# Patient Record
Sex: Female | Born: 1990 | Race: White | Hispanic: No | Marital: Married | Smoking: Never smoker
Health system: Southern US, Community
[De-identification: ages and names within clinical notes are randomized; demographics above are authoritative.]

## PROBLEM LIST (undated history)

## (undated) ENCOUNTER — Inpatient Hospital Stay (HOSPITAL_COMMUNITY): Payer: Self-pay

## (undated) DIAGNOSIS — G709 Myoneural disorder, unspecified: Secondary | ICD-10-CM

## (undated) DIAGNOSIS — Z789 Other specified health status: Secondary | ICD-10-CM

## (undated) DIAGNOSIS — K589 Irritable bowel syndrome without diarrhea: Secondary | ICD-10-CM

## (undated) DIAGNOSIS — G510 Bell's palsy: Secondary | ICD-10-CM

## (undated) HISTORY — PX: NO PAST SURGERIES: SHX2092

## (undated) HISTORY — PX: CHOLECYSTECTOMY: SHX55

---

## 2010-11-05 LAB — ABO/RH: RH Type: POSITIVE

## 2010-11-05 LAB — STREP B DNA PROBE: GBS: NEGATIVE

## 2010-11-05 LAB — ANTIBODY SCREEN: Antibody Screen: NEGATIVE

## 2010-12-06 ENCOUNTER — Inpatient Hospital Stay (HOSPITAL_COMMUNITY)
Admission: AD | Admit: 2010-12-06 | Discharge: 2010-12-06 | Disposition: A | Discharge status: Home / Self Care | Payer: No Typology Code available for payment source | Source: Ambulatory Visit | Attending: Obstetrics and Gynecology | Admitting: Obstetrics and Gynecology

## 2010-12-06 DIAGNOSIS — O210 Mild hyperemesis gravidarum: Secondary | ICD-10-CM

## 2010-12-06 LAB — URINALYSIS, ROUTINE W REFLEX MICROSCOPIC
Bilirubin Urine: NEGATIVE
Hgb urine dipstick: NEGATIVE
Ketones, ur: NEGATIVE mg/dL
Specific Gravity, Urine: 1.015 (ref 1.005–1.030)
Urobilinogen, UA: 0.2 mg/dL (ref 0.0–1.0)

## 2010-12-06 LAB — COMPREHENSIVE METABOLIC PANEL
ALT: 14 U/L (ref 0–35)
Alkaline Phosphatase: 67 U/L (ref 39–117)
BUN: 8 mg/dL (ref 6–23)
CO2: 22 mEq/L (ref 19–32)
Calcium: 9.7 mg/dL (ref 8.4–10.5)
GFR calc non Af Amer: >60 mL/min (ref >60)
Glucose, Bld: 80 mg/dL (ref 70–99)
Potassium: 3.9 mEq/L (ref 3.5–5.1)
Sodium: 135 mEq/L (ref 135–145)

## 2011-05-12 ENCOUNTER — Inpatient Hospital Stay (HOSPITAL_COMMUNITY): Payer: No Typology Code available for payment source

## 2011-05-12 ENCOUNTER — Encounter (HOSPITAL_COMMUNITY): Payer: Self-pay

## 2011-05-12 ENCOUNTER — Inpatient Hospital Stay (HOSPITAL_COMMUNITY)
Admission: AD | Admit: 2011-05-12 | Discharge: 2011-05-12 | Disposition: A | Discharge status: Home / Self Care | Payer: No Typology Code available for payment source | Source: Ambulatory Visit | Attending: Obstetrics and Gynecology | Admitting: Obstetrics and Gynecology

## 2011-05-12 DIAGNOSIS — O368190 Decreased fetal movements, unspecified trimester, not applicable or unspecified: Secondary | ICD-10-CM | POA: Insufficient documentation

## 2011-05-12 HISTORY — DX: Irritable bowel syndrome without diarrhea: K58.9

## 2011-05-12 NOTE — ED Provider Notes (Signed)
History     Chief Complaint  Patient presents with  . Decreased Fetal Movement   HPI 20 y.o. G1 P0 at [redacted]w[redacted]d Presents from office for NST and [place].  Has not felt FM since last Pm, except while here, has felt fetal movement. No leaking, bleeding or contractions. Was seen by Dr Claiborne Billings in office.    Past Medical History  Diagnosis Date  . Irritable bowel syndrome (IBS)     Past Surgical History  Procedure Date  . Cholecystectomy     History reviewed. No pertinent family history.  History  Substance Use Topics  . Smoking status: Never Smoker   . Smokeless tobacco: Never Used  . Alcohol Use: No    Allergies:  Allergies  Allergen Reactions  . Food Other (See Comments)    Pear-childhood reaction  . Latex Rash    Prescriptions prior to admission  Medication Sig Dispense Refill  . Pediatric Multivit-Minerals-C (GUMMI BEAR MULTIVITAMIN/MIN PO) Take 2 tablets by mouth daily.          ROS See above Physical Exam   Blood pressure 119/55, pulse 102, temperature 97.9 F (36.6 C), temperature source Oral, resp. rate 16, height 5\' 1"  (1.549 m), weight 179 lb (81.194 kg).  Physical Exam  Constitutional: She is oriented to person, place, and time. She appears well-developed and well-nourished. No distress.  Cardiovascular: Normal rate.   Respiratory: Effort normal.  GI: Soft. She exhibits no distension and no mass. There is no tenderness. There is no rebound and no guarding.  Genitourinary:       NST Reactive with average variability and no decels. No UCs... Category 1  Musculoskeletal: Normal range of motion.  Neurological: She is alert and oriented to person, place, and time.  Skin: Skin is warm and dry.  Psychiatric: She has a normal mood and affect.    MAU Course  Procedures   Assessment and Plan  A/P:  Discussed with Dr Henderson Cloud          Will check BPP and AFI          IF WNL, will d/c home.    [facility] 05/12/2011, 3:29 PM

## 2011-05-12 NOTE — Progress Notes (Addendum)
Pt states was sent to MAU for eval of decreased fm per Dr. Henderson Cloud, cervical exam showed external os open, internal os closed. Last good fm felt last pm sometime. Denies bleeding or lof.

## 2011-06-01 ENCOUNTER — Inpatient Hospital Stay (HOSPITAL_COMMUNITY)
Admission: AD | Admit: 2011-06-01 | Discharge: 2011-06-01 | Disposition: A | Discharge status: Home / Self Care | Payer: No Typology Code available for payment source | Source: Ambulatory Visit | Attending: Obstetrics and Gynecology | Admitting: Obstetrics and Gynecology

## 2011-06-01 DIAGNOSIS — G510 Bell's palsy: Secondary | ICD-10-CM

## 2011-06-01 DIAGNOSIS — IMO0002 Reserved for concepts with insufficient information to code with codable children: Secondary | ICD-10-CM

## 2011-06-01 DIAGNOSIS — O99891 Other specified diseases and conditions complicating pregnancy: Secondary | ICD-10-CM | POA: Insufficient documentation

## 2011-06-01 DIAGNOSIS — R209 Unspecified disturbances of skin sensation: Secondary | ICD-10-CM | POA: Insufficient documentation

## 2011-06-01 MED ORDER — PREDNISONE 50 MG PO TABS
60.0000 mg | ORAL_TABLET | Freq: Once | ORAL | Status: AC
  Administered 2011-06-01: 60 mg via ORAL
  Filled 2011-06-01: qty 1

## 2011-06-01 MED ORDER — PREDNISONE 10 MG PO TABS: ORAL_TABLET | ORAL | Status: DC

## 2011-06-01 NOTE — ED Provider Notes (Signed)
History     CSN: [id number] Arrival date & time: 06/01/2011  5:13 PM   None     Chief Complaint  Patient presents with  . Numbness    HPI Meagan Austin is a 20 y.o. female @ [redacted]w[redacted]d gestation who presents to MAU for left side facial numbness. She has a history of Bell's Palsy as a child. The symptoms started yesterday with just a tingling on the right side of the tongue. Today noted right eye tearing and face feeling numb on right side.  Denies extremity weakness or other problems.  Past Medical History  Diagnosis Date  . Irritable bowel syndrome (IBS)     Past Surgical History  Procedure Date  . Cholecystectomy     No family history on file.  History  Substance Use Topics  . Smoking status: Never Smoker   . Smokeless tobacco: Never Used  . Alcohol Use: No    OB History    Grav Para Term Preterm Abortions TAB SAB Ect Mult Living   1               Review of Systems  Constitutional: Negative for fever, chills and appetite change.  HENT: Negative for hearing loss, facial swelling, neck pain and tinnitus.        Facial numbness on the right. Decreased taste sensation.  Eyes: Positive for photophobia and discharge. Negative for visual disturbance.  Respiratory: Negative.   Cardiovascular: Negative.   Gastrointestinal: Negative.   Genitourinary: Negative for urgency, frequency, vaginal bleeding and vaginal discharge.       [redacted] weeks gestation  Musculoskeletal: Negative for back pain.  Neurological: Negative for light-headedness and headaches.  Psychiatric/Behavioral: Negative for confusion and agitation.    Allergies  Food and Latex  Home Medications  No current outpatient prescriptions on file.  BP 126/55  Pulse 104  Temp(Src) 98.4 F (36.9 C) (Oral)  Resp 16  Ht 5\' 1"  (1.549 m)  Wt 187 lb (84.823 kg)  BMI 35.33 kg/m2  Physical Exam  Nursing note and vitals reviewed. Constitutional: She is oriented to person, place, and time. She appears well-developed  and well-nourished. No distress.  HENT:  Head: Atraumatic.  Eyes: EOM are normal.  Neck: Neck supple.  Cardiovascular:       tachycardia  Pulmonary/Chest: Effort normal.  Abdominal: Soft. There is no tenderness.       [redacted] weeks gestation  Genitourinary:       Cervix finger tip thick, high.  Musculoskeletal: Normal range of motion.  Neurological: She is alert and oriented to person, place, and time. A cranial nerve deficit (right eye does not close completely. Decreased sensation right side of face. Can not distinguish between salt and sweet on right side of tongue.) and sensory deficit is present.  Skin: Skin is warm and dry.  Psychiatric: She has a normal mood and affect. Her behavior is normal. Judgment and thought content normal.   Assessment:  Bell's Palsy  Plan:  Prednisone taper   Follow up in the office  ED Course: Discussed with Dr. Janee Morn  Procedures            [place], NP 06/01/11 [phone]

## 2011-06-01 NOTE — Progress Notes (Signed)
Pt states her tongue started feeling funny yesterday but she did not pay any attention to it and started feeling the numbness when she woke up

## 2011-06-01 NOTE — Progress Notes (Signed)
Pt states noted r sided facial numbness around 1030, r sided mild facial droop, unable to symmetrically puff out cheeks, equal grips bilaterally. Denies bleeding or lof, +FM.

## 2011-06-10 LAB — CULTURE, BETA STREP (GROUP B ONLY): Organism ID, Bacteria: NEGATIVE

## 2011-06-29 ENCOUNTER — Observation Stay (HOSPITAL_COMMUNITY)
Admission: AD | Admit: 2011-06-29 | Discharge: 2011-06-29 | Disposition: A | Discharge status: Home / Self Care | Payer: No Typology Code available for payment source | Source: Ambulatory Visit | Attending: Obstetrics & Gynecology | Admitting: Obstetrics & Gynecology

## 2011-06-29 ENCOUNTER — Encounter (HOSPITAL_COMMUNITY): Payer: Self-pay

## 2011-06-29 DIAGNOSIS — O99350 Diseases of the nervous system complicating pregnancy, unspecified trimester: Secondary | ICD-10-CM | POA: Diagnosis present

## 2011-06-29 DIAGNOSIS — K589 Irritable bowel syndrome without diarrhea: Secondary | ICD-10-CM | POA: Insufficient documentation

## 2011-06-29 DIAGNOSIS — O479 False labor, unspecified: Principal | ICD-10-CM | POA: Insufficient documentation

## 2011-06-29 DIAGNOSIS — G510 Bell's palsy: Secondary | ICD-10-CM | POA: Diagnosis present

## 2011-06-29 HISTORY — DX: Irritable bowel syndrome, unspecified: K58.9

## 2011-06-29 HISTORY — DX: Myoneural disorder, unspecified: G70.9

## 2011-06-29 NOTE — Discharge Summary (Signed)
SVE 4/50/-1-2.  Decreased UA & pain c UCs.  D/C home c labor precautions.  Instructed to return to hospital for induction at 0700 tomorrow (Friday, 06-30-11) AM c Dr Henderson Cloud (per pt's previous statement)

## 2011-06-29 NOTE — Progress Notes (Signed)
UCs started about 1345.  States she was 4-5cm in the office yesterday

## 2011-06-30 ENCOUNTER — Encounter (HOSPITAL_COMMUNITY): Payer: Self-pay

## 2011-06-30 ENCOUNTER — Inpatient Hospital Stay (HOSPITAL_COMMUNITY)
Admission: AD | Admit: 2011-06-30 | Discharge: 2011-07-03 | DRG: 766 | Disposition: A | Discharge status: Home / Self Care | Payer: No Typology Code available for payment source | Source: Ambulatory Visit | Attending: Obstetrics and Gynecology | Admitting: Obstetrics and Gynecology

## 2011-06-30 DIAGNOSIS — D649 Anemia, unspecified: Secondary | ICD-10-CM | POA: Diagnosis not present

## 2011-06-30 DIAGNOSIS — O9903 Anemia complicating the puerperium: Secondary | ICD-10-CM | POA: Diagnosis not present

## 2011-06-30 DIAGNOSIS — G510 Bell's palsy: Secondary | ICD-10-CM

## 2011-06-30 HISTORY — DX: Other specified health status: Z78.9

## 2011-06-30 LAB — CBC
MCH: 30.2 pg (ref 26.0–34.0)
MCHC: 33.8 g/dL (ref 30.0–36.0)
MCV: 89.2 fL (ref 78.0–100.0)
Platelets: 166 10*3/uL (ref 150–400)
RBC: 3.78 MIL/uL — ABNORMAL LOW (ref 3.87–5.11)
RDW: 13.9 % (ref 11.5–15.5)

## 2011-06-30 LAB — RPR: RPR Ser Ql: NONREACTIVE

## 2011-06-30 MED ORDER — LIDOCAINE HCL (PF) 1 % IJ SOLN
30.0000 mL | INTRAMUSCULAR | Status: DC | PRN
  Filled 2011-06-30: qty 30

## 2011-06-30 MED ORDER — ACETAMINOPHEN 325 MG PO TABS
650.0000 mg | ORAL_TABLET | ORAL | Status: DC | PRN
  Administered 2011-06-30: 650 mg via ORAL
  Filled 2011-06-30: qty 2

## 2011-06-30 MED ORDER — CITRIC ACID-SODIUM CITRATE 334-500 MG/5ML PO SOLN
30.0000 mL | ORAL | Status: DC | PRN
  Filled 2011-06-30: qty 15

## 2011-06-30 MED ORDER — PHENYLEPHRINE 40 MCG/ML (10ML) SYRINGE FOR IV PUSH (FOR BLOOD PRESSURE SUPPORT): 80.0000 ug | PREFILLED_SYRINGE | INTRAVENOUS | Status: DC | PRN

## 2011-06-30 MED ORDER — OXYTOCIN 20 UNITS IN LACTATED RINGERS INFUSION - SIMPLE: 125.0000 mL/h | Freq: Once | INTRAVENOUS | Status: DC

## 2011-06-30 MED ORDER — FENTANYL 2.5 MCG/ML BUPIVACAINE 1/10 % EPIDURAL INFUSION (WH - ANES)
INTRAMUSCULAR | Status: DC | PRN
  Administered 2011-06-30: 12 mL/h via EPIDURAL

## 2011-06-30 MED ORDER — OXYCODONE-ACETAMINOPHEN 5-325 MG PO TABS: 2.0000 | ORAL_TABLET | ORAL | Status: DC | PRN

## 2011-06-30 MED ORDER — FENTANYL 2.5 MCG/ML BUPIVACAINE 1/10 % EPIDURAL INFUSION (WH - ANES)
14.0000 mL/h | INTRAMUSCULAR | Status: DC
  Administered 2011-06-30 – 2011-07-01 (×7): 14 mL/h via EPIDURAL
  Filled 2011-06-30 (×7): qty 60

## 2011-06-30 MED ORDER — PHENYLEPHRINE 40 MCG/ML (10ML) SYRINGE FOR IV PUSH (FOR BLOOD PRESSURE SUPPORT)
80.0000 ug | PREFILLED_SYRINGE | INTRAVENOUS | Status: DC | PRN
  Filled 2011-06-30: qty 5

## 2011-06-30 MED ORDER — EPHEDRINE 5 MG/ML INJ: 10.0000 mg | INTRAVENOUS | Status: DC | PRN

## 2011-06-30 MED ORDER — LACTATED RINGERS IV SOLN
INTRAVENOUS | Status: DC
  Administered 2011-06-30: 10:00:00 via INTRAVENOUS
  Administered 2011-06-30: 300 mL via INTRAVENOUS
  Administered 2011-06-30: 18:00:00 via INTRAVENOUS

## 2011-06-30 MED ORDER — LACTATED RINGERS IV SOLN: 500.0000 mL | INTRAVENOUS | Status: DC | PRN

## 2011-06-30 MED ORDER — FLEET ENEMA 7-19 GM/118ML RE ENEM: 1.0000 | ENEMA | RECTAL | Status: DC | PRN

## 2011-06-30 MED ORDER — TERBUTALINE SULFATE 1 MG/ML IJ SOLN: 0.2500 mg | Freq: Once | INTRAMUSCULAR | Status: AC | PRN

## 2011-06-30 MED ORDER — OXYTOCIN BOLUS FROM INFUSION
500.0000 mL | Freq: Once | INTRAVENOUS | Status: DC
  Filled 2011-06-30: qty 500
  Filled 2011-06-30: qty 1000

## 2011-06-30 MED ORDER — IBUPROFEN 600 MG PO TABS: 600.0000 mg | ORAL_TABLET | Freq: Four times a day (QID) | ORAL | Status: DC | PRN

## 2011-06-30 MED ORDER — ZOLPIDEM TARTRATE 10 MG PO TABS: 10.0000 mg | ORAL_TABLET | Freq: Every evening | ORAL | Status: DC | PRN

## 2011-06-30 MED ORDER — EPHEDRINE 5 MG/ML INJ
10.0000 mg | INTRAVENOUS | Status: DC | PRN
  Filled 2011-06-30: qty 4

## 2011-06-30 MED ORDER — ONDANSETRON HCL 4 MG/2ML IJ SOLN: 4.0000 mg | Freq: Four times a day (QID) | INTRAMUSCULAR | Status: DC | PRN

## 2011-06-30 MED ORDER — DIPHENHYDRAMINE HCL 50 MG/ML IJ SOLN: 12.5000 mg | INTRAMUSCULAR | Status: DC | PRN

## 2011-06-30 MED ORDER — OXYTOCIN 20 UNITS IN LACTATED RINGERS INFUSION - SIMPLE
1.0000 m[IU]/min | INTRAVENOUS | Status: DC
  Administered 2011-06-30: 2 m[IU]/min via INTRAVENOUS

## 2011-06-30 MED ORDER — SODIUM BICARBONATE 8.4 % IV SOLN
INTRAVENOUS | Status: DC | PRN
  Administered 2011-06-30: 4 mL via EPIDURAL

## 2011-06-30 MED ORDER — LACTATED RINGERS IV SOLN
500.0000 mL | Freq: Once | INTRAVENOUS | Status: AC
  Administered 2011-07-01: 500 mL via INTRAVENOUS

## 2011-06-30 NOTE — H&P (Signed)
20 y.o. [redacted]w[redacted]d   G1P0000 comes in for induction at term with advanced cervical dilation.  Otherwise has good fetal movement and no bleeding.  Past Medical History  Diagnosis Date  . Irritable bowel syndrome (IBS) 06-29-11    Bilat ear infections, finished  Amoxicillin last weekend (06-24-11)  . Preterm labor   . Neuromuscular disorder     R side Bell's Palsy  . No pertinent past medical history     Past Surgical History  Procedure Date  . Cholecystectomy   . Cholecystectomy     2011  . No past surgeries     OB History    Grav Para Term Preterm Abortions TAB SAB Ect Mult Living   1 0 0 0 0 0 0 0 0 0      # Outc Date GA Lbr Len/2nd Wgt Sex Del Anes PTL Lv   1 CUR               History   Social History  . Marital Status: Married    Spouse Name: N/A    Number of Children: N/A  . Years of Education: N/A   Occupational History  . Not on file.   Social History Main Topics  . Smoking status: Never Smoker   . Smokeless tobacco: Never Used  . Alcohol Use: No  . Drug Use: No  . Sexually Active: Yes   Other Topics Concern  . Not on file   Social History Narrative  . No narrative on file   Food and Latex   Prenatal Course:  Uncomplicated except for Bell's palsy; no other complication.  Filed Vitals:   06/30/11 0753  BP: 140/78  Pulse: 110  Temp: 98 F (36.7 C)  Resp: 18     Lungs/Cor:  NAD Abdomen:  soft, gravid Ex:  no cords, erythema SVE:  5/80/-1, AROM clear. FHTs:  150s, good STV, NST R Toco:  q10   A/P   [redacted]w[redacted]d desires induction.  GBS neg.  Aunna Snooks A

## 2011-06-30 NOTE — Progress Notes (Signed)
Dr. Henderson Cloud called and notified of maternal temp of 98.7, FHR decrease but still tachy, and UC's.  Will check SVE at midnight.

## 2011-06-30 NOTE — Anesthesia Preprocedure Evaluation (Addendum)
Anesthesia Evaluation  Patient identified by MRN, date of birth, ID band Patient awake    Reviewed: Allergy & Precautions, H&P , Patient's Chart, lab work & pertinent test results  Airway Mallampati: III TM Distance: >3 FB Neck ROM: full    Dental  (+) Teeth Intact   Pulmonary  clear to auscultation        Cardiovascular regular Normal    Neuro/Psych    GI/Hepatic   Endo/Other  Morbid obesity  Renal/GU      Musculoskeletal   Abdominal   Peds  Hematology   Anesthesia Other Findings       Reproductive/Obstetrics (+) Pregnancy                        Anesthesia Physical Anesthesia Plan  ASA: II  Anesthesia Plan: Epidural   Post-op Pain Management:    Induction:   Airway Management Planned:   Additional Equipment:   Intra-op Plan:   Post-operative Plan:   Informed Consent: I have reviewed the patients History and Physical, chart, labs and discussed the procedure including the risks, benefits and alternatives for the proposed anesthesia with the patient or authorized representative who has indicated his/her understanding and acceptance.   Dental Advisory Given  Plan Discussed with:   Anesthesia Plan Comments: (Labs checked- platelets confirmed with RN in room. Fetal heart tracing, per RN, reported to be stable enough for sitting procedure. Discussed epidural, and patient consents to the procedure:  included risk of possible headache,backache, failed block, allergic reaction, and nerve injury. This patient was asked if she had any questions or concerns before the procedure started. )        Anesthesia Quick Evaluation

## 2011-06-30 NOTE — Anesthesia Procedure Notes (Addendum)

## 2011-06-30 NOTE — Progress Notes (Signed)
Pt comfortable.  Filed Vitals:   06/30/11 2101  BP: 120/65  Pulse: 104  Temp: 99.4 F (37.4 C)  Resp: 18   FHTs 150-180s, good variability and NST R; no decels. Toco q3-5 SVE 9/C/-2  Will watch FHTs- tachycardia probably secondary developing temperature.  Tylenol given; will give antibiotics if spikes. Emrys Mceachron A

## 2011-07-01 ENCOUNTER — Encounter (HOSPITAL_COMMUNITY): Payer: Self-pay | Admitting: Anesthesiology

## 2011-07-01 ENCOUNTER — Encounter (HOSPITAL_COMMUNITY)
Admission: AD | Disposition: A | Discharge status: Home / Self Care | Payer: Self-pay | Source: Ambulatory Visit | Attending: Obstetrics and Gynecology

## 2011-07-01 ENCOUNTER — Inpatient Hospital Stay (HOSPITAL_COMMUNITY): Payer: No Typology Code available for payment source | Admitting: Anesthesiology

## 2011-07-01 ENCOUNTER — Encounter (HOSPITAL_COMMUNITY): Payer: Self-pay

## 2011-07-01 SURGERY — Surgical Case
Anesthesia: Epidural | Site: Abdomen | Wound class: Clean Contaminated

## 2011-07-01 MED ORDER — OXYTOCIN 10 UNIT/ML IJ SOLN
INTRAMUSCULAR | Status: AC
  Filled 2011-07-01: qty 4

## 2011-07-01 MED ORDER — WITCH HAZEL-GLYCERIN EX PADS: 1.0000 "application " | MEDICATED_PAD | CUTANEOUS | Status: DC | PRN

## 2011-07-01 MED ORDER — SODIUM BICARBONATE 8.4 % IV SOLN
INTRAVENOUS | Status: AC
  Filled 2011-07-01: qty 50

## 2011-07-01 MED ORDER — DIPHENHYDRAMINE HCL 25 MG PO CAPS: 25.0000 mg | ORAL_CAPSULE | ORAL | Status: DC | PRN

## 2011-07-01 MED ORDER — KETOROLAC TROMETHAMINE 60 MG/2ML IM SOLN
60.0000 mg | Freq: Once | INTRAMUSCULAR | Status: AC | PRN
  Administered 2011-07-01: 60 mg via INTRAMUSCULAR

## 2011-07-01 MED ORDER — LACTATED RINGERS IV SOLN
INTRAVENOUS | Status: DC | PRN
  Administered 2011-07-01 (×4): via INTRAVENOUS

## 2011-07-01 MED ORDER — LIDOCAINE-EPINEPHRINE (PF) 2 %-1:200000 IJ SOLN
INTRAMUSCULAR | Status: AC
  Filled 2011-07-01: qty 40

## 2011-07-01 MED ORDER — MORPHINE SULFATE (PF) 0.5 MG/ML IJ SOLN
INTRAMUSCULAR | Status: DC | PRN
  Administered 2011-07-01: 1 mg via EPIDURAL

## 2011-07-01 MED ORDER — MIDAZOLAM HCL 2 MG/2ML IJ SOLN
INTRAMUSCULAR | Status: AC
  Filled 2011-07-01: qty 2

## 2011-07-01 MED ORDER — METHYLERGONOVINE MALEATE 0.2 MG PO TABS: 0.2000 mg | ORAL_TABLET | ORAL | Status: DC | PRN

## 2011-07-01 MED ORDER — OXYTOCIN 20 UNITS IN LACTATED RINGERS INFUSION - SIMPLE
125.0000 mL/h | INTRAVENOUS | Status: AC
  Administered 2011-07-01: 125 mL/h via INTRAVENOUS

## 2011-07-01 MED ORDER — LANOLIN HYDROUS EX OINT: 1.0000 "application " | TOPICAL_OINTMENT | CUTANEOUS | Status: DC | PRN

## 2011-07-01 MED ORDER — OXYTOCIN 20 UNITS IN LACTATED RINGERS INFUSION - SIMPLE
INTRAVENOUS | Status: DC | PRN
  Administered 2011-07-01 (×2): 20 [IU] via INTRAVENOUS

## 2011-07-01 MED ORDER — SCOPOLAMINE 1 MG/3DAYS TD PT72
1.0000 | MEDICATED_PATCH | Freq: Once | TRANSDERMAL | Status: DC
  Administered 2011-07-01: 1.5 mg via TRANSDERMAL

## 2011-07-01 MED ORDER — IBUPROFEN 600 MG PO TABS
600.0000 mg | ORAL_TABLET | Freq: Four times a day (QID) | ORAL | Status: DC
  Administered 2011-07-02 – 2011-07-03 (×6): 600 mg via ORAL
  Filled 2011-07-01: qty 1

## 2011-07-01 MED ORDER — MORPHINE SULFATE 0.5 MG/ML IJ SOLN
INTRAMUSCULAR | Status: AC
  Filled 2011-07-01: qty 10

## 2011-07-01 MED ORDER — FLEET ENEMA 7-19 GM/118ML RE ENEM: 1.0000 | ENEMA | Freq: Every day | RECTAL | Status: DC | PRN

## 2011-07-01 MED ORDER — NALOXONE HCL 0.4 MG/ML IJ SOLN: 0.4000 mg | INTRAMUSCULAR | Status: DC | PRN

## 2011-07-01 MED ORDER — PRENATAL MULTIVITAMIN CH
1.0000 | ORAL_TABLET | Freq: Every day | ORAL | Status: DC
  Administered 2011-07-02 – 2011-07-03 (×2): 1 via ORAL
  Filled 2011-07-01: qty 1

## 2011-07-01 MED ORDER — ONDANSETRON HCL 4 MG PO TABS: 4.0000 mg | ORAL_TABLET | ORAL | Status: DC | PRN

## 2011-07-01 MED ORDER — IBUPROFEN 600 MG PO TABS
600.0000 mg | ORAL_TABLET | Freq: Four times a day (QID) | ORAL | Status: DC | PRN
  Filled 2011-07-01 (×5): qty 1

## 2011-07-01 MED ORDER — NALBUPHINE SYRINGE 5 MG/0.5 ML
5.0000 mg | INJECTION | INTRAMUSCULAR | Status: DC | PRN
  Filled 2011-07-01: qty 1

## 2011-07-01 MED ORDER — MIDAZOLAM HCL 5 MG/5ML IJ SOLN
INTRAMUSCULAR | Status: DC | PRN
  Administered 2011-07-01 (×2): 1 mg via INTRAVENOUS

## 2011-07-01 MED ORDER — OXYCODONE-ACETAMINOPHEN 5-325 MG PO TABS
1.0000 | ORAL_TABLET | ORAL | Status: DC | PRN
  Administered 2011-07-02 – 2011-07-03 (×5): 1 via ORAL
  Filled 2011-07-01: qty 1
  Filled 2011-07-01: qty 2
  Filled 2011-07-01 (×3): qty 1

## 2011-07-01 MED ORDER — DIPHENHYDRAMINE HCL 50 MG/ML IJ SOLN: 25.0000 mg | INTRAMUSCULAR | Status: DC | PRN

## 2011-07-01 MED ORDER — DIPHENHYDRAMINE HCL 50 MG/ML IJ SOLN: 12.5000 mg | INTRAMUSCULAR | Status: DC | PRN

## 2011-07-01 MED ORDER — SCOPOLAMINE 1 MG/3DAYS TD PT72
MEDICATED_PATCH | TRANSDERMAL | Status: AC
  Filled 2011-07-01: qty 1

## 2011-07-01 MED ORDER — MEASLES, MUMPS & RUBELLA VAC ~~LOC~~ INJ
0.5000 mL | INJECTION | Freq: Once | SUBCUTANEOUS | Status: DC
  Filled 2011-07-01: qty 0.5

## 2011-07-01 MED ORDER — SIMETHICONE 80 MG PO CHEW: 80.0000 mg | CHEWABLE_TABLET | ORAL | Status: DC | PRN

## 2011-07-01 MED ORDER — CEFAZOLIN SODIUM 1-5 GM-% IV SOLN
INTRAVENOUS | Status: DC | PRN
  Administered 2011-07-01: 2 g via INTRAVENOUS

## 2011-07-01 MED ORDER — CEFAZOLIN SODIUM 1-5 GM-% IV SOLN
INTRAVENOUS | Status: AC
  Filled 2011-07-01: qty 100

## 2011-07-01 MED ORDER — ONDANSETRON HCL 4 MG/2ML IJ SOLN
INTRAMUSCULAR | Status: AC
  Filled 2011-07-01: qty 2

## 2011-07-01 MED ORDER — KETOROLAC TROMETHAMINE 30 MG/ML IJ SOLN: 15.0000 mg | Freq: Once | INTRAMUSCULAR | Status: DC | PRN

## 2011-07-01 MED ORDER — ONDANSETRON HCL 4 MG/2ML IJ SOLN
4.0000 mg | Freq: Three times a day (TID) | INTRAMUSCULAR | Status: DC | PRN
  Filled 2011-07-01: qty 2

## 2011-07-01 MED ORDER — HYDROMORPHONE HCL PF 1 MG/ML IJ SOLN: 0.2500 mg | INTRAMUSCULAR | Status: DC | PRN

## 2011-07-01 MED ORDER — KETOROLAC TROMETHAMINE 60 MG/2ML IM SOLN
INTRAMUSCULAR | Status: AC
  Filled 2011-07-01: qty 2

## 2011-07-01 MED ORDER — MENTHOL 3 MG MT LOZG: 1.0000 | LOZENGE | OROMUCOSAL | Status: DC | PRN

## 2011-07-01 MED ORDER — MORPHINE SULFATE (PF) 0.5 MG/ML IJ SOLN
INTRAMUSCULAR | Status: DC | PRN
  Administered 2011-07-01: 4 mg via EPIDURAL

## 2011-07-01 MED ORDER — TETANUS-DIPHTH-ACELL PERTUSSIS 5-2.5-18.5 LF-MCG/0.5 IM SUSP: 0.5000 mL | Freq: Once | INTRAMUSCULAR | Status: DC

## 2011-07-01 MED ORDER — ZOLPIDEM TARTRATE 5 MG PO TABS: 5.0000 mg | ORAL_TABLET | Freq: Every evening | ORAL | Status: DC | PRN

## 2011-07-01 MED ORDER — LACTATED RINGERS IV SOLN
INTRAVENOUS | Status: DC
  Administered 2011-07-01 (×2): via INTRAVENOUS

## 2011-07-01 MED ORDER — DIPHENHYDRAMINE HCL 25 MG PO CAPS: 25.0000 mg | ORAL_CAPSULE | Freq: Four times a day (QID) | ORAL | Status: DC | PRN

## 2011-07-01 MED ORDER — MEPERIDINE HCL 25 MG/ML IJ SOLN: 6.2500 mg | INTRAMUSCULAR | Status: DC | PRN

## 2011-07-01 MED ORDER — ONDANSETRON HCL 4 MG/2ML IJ SOLN
4.0000 mg | INTRAMUSCULAR | Status: DC | PRN
  Administered 2011-07-01 (×2): 4 mg via INTRAVENOUS
  Filled 2011-07-01: qty 2

## 2011-07-01 MED ORDER — PROMETHAZINE HCL 25 MG/ML IJ SOLN: 6.2500 mg | INTRAMUSCULAR | Status: DC | PRN

## 2011-07-01 MED ORDER — OXYTOCIN 20 UNITS IN LACTATED RINGERS INFUSION - SIMPLE
INTRAVENOUS | Status: AC
  Filled 2011-07-01: qty 1000

## 2011-07-01 MED ORDER — METHYLERGONOVINE MALEATE 0.2 MG/ML IJ SOLN: 0.2000 mg | INTRAMUSCULAR | Status: DC | PRN

## 2011-07-01 MED ORDER — SIMETHICONE 80 MG PO CHEW
80.0000 mg | CHEWABLE_TABLET | Freq: Three times a day (TID) | ORAL | Status: DC
  Administered 2011-07-01 – 2011-07-03 (×6): 80 mg via ORAL

## 2011-07-01 MED ORDER — NALOXONE HCL 0.4 MG/ML IJ SOLN
1.0000 ug/kg/h | INTRAMUSCULAR | Status: DC | PRN
  Filled 2011-07-01: qty 2.5

## 2011-07-01 MED ORDER — KETOROLAC TROMETHAMINE 30 MG/ML IJ SOLN
30.0000 mg | Freq: Four times a day (QID) | INTRAMUSCULAR | Status: AC | PRN
  Administered 2011-07-01: 30 mg via INTRAVENOUS
  Filled 2011-07-01: qty 1

## 2011-07-01 MED ORDER — FENTANYL CITRATE 0.05 MG/ML IJ SOLN
INTRAMUSCULAR | Status: DC | PRN
  Administered 2011-07-01: 100 ug via INTRAVENOUS

## 2011-07-01 MED ORDER — SENNOSIDES-DOCUSATE SODIUM 8.6-50 MG PO TABS
2.0000 | ORAL_TABLET | Freq: Every day | ORAL | Status: DC
  Administered 2011-07-01 – 2011-07-02 (×2): 2 via ORAL

## 2011-07-01 MED ORDER — KETOROLAC TROMETHAMINE 30 MG/ML IJ SOLN: 30.0000 mg | Freq: Four times a day (QID) | INTRAMUSCULAR | Status: AC | PRN

## 2011-07-01 MED ORDER — BISACODYL 10 MG RE SUPP: 10.0000 mg | Freq: Every day | RECTAL | Status: DC | PRN

## 2011-07-01 MED ORDER — FENTANYL CITRATE 0.05 MG/ML IJ SOLN
INTRAMUSCULAR | Status: AC
  Filled 2011-07-01: qty 2

## 2011-07-01 MED ORDER — DIBUCAINE 1 % RE OINT: 1.0000 "application " | TOPICAL_OINTMENT | RECTAL | Status: DC | PRN

## 2011-07-01 MED ORDER — FERROUS SULFATE 325 (65 FE) MG PO TABS
325.0000 mg | ORAL_TABLET | Freq: Two times a day (BID) | ORAL | Status: DC
  Administered 2011-07-02 (×2): 325 mg via ORAL
  Filled 2011-07-01 (×2): qty 1

## 2011-07-01 MED ORDER — SODIUM CHLORIDE 0.9 % IJ SOLN: 3.0000 mL | INTRAMUSCULAR | Status: DC | PRN

## 2011-07-01 SURGICAL SUPPLY — 27 items
CHLORAPREP W/TINT 26ML (MISCELLANEOUS) ×6 IMPLANT
CLOTH BEACON ORANGE TIMEOUT ST (SAFETY) ×2 IMPLANT
DRSG COVADERM 4X6 (GAUZE/BANDAGES/DRESSINGS) ×2 IMPLANT
ELECT REM PT RETURN 9FT ADLT (ELECTROSURGICAL) ×2
ELECTRODE REM PT RTRN 9FT ADLT (ELECTROSURGICAL) ×1 IMPLANT
EXTRACTOR VACUUM BELL STYLE (SUCTIONS) IMPLANT
GLOVE BIO SURGEON STRL SZ7 (GLOVE) ×4 IMPLANT
GOWN PREVENTION PLUS LG XLONG (DISPOSABLE) ×4 IMPLANT
KIT ABG SYR 3ML LUER SLIP (SYRINGE) IMPLANT
NEEDLE HYPO 25X5/8 SAFETYGLIDE (NEEDLE) ×2 IMPLANT
NS IRRIG 1000ML POUR BTL (IV SOLUTION) ×2 IMPLANT
PACK C SECTION WH (CUSTOM PROCEDURE TRAY) ×2 IMPLANT
RETRACTOR WND ALEXIS 25 LRG (MISCELLANEOUS) ×1 IMPLANT
RTRCTR WOUND ALEXIS 25CM LRG (MISCELLANEOUS) ×2
SLEEVE SCD COMPRESS KNEE MED (MISCELLANEOUS) IMPLANT
STAPLER VISISTAT 35W (STAPLE) IMPLANT
SUT MNCRL 0 VIOLET CTX 36 (SUTURE) ×2 IMPLANT
SUT MONOCRYL 0 CTX 36 (SUTURE) ×2
SUT PDS AB 0 CTX 60 (SUTURE) IMPLANT
SUT PLAIN 2 0 XLH (SUTURE) ×2 IMPLANT
SUT VIC AB 0 CT1 27 (SUTURE) ×2
SUT VIC AB 0 CT1 27XBRD ANBCTR (SUTURE) ×2 IMPLANT
SUT VIC AB 2-0 CT1 27 (SUTURE) ×1
SUT VIC AB 2-0 CT1 TAPERPNT 27 (SUTURE) ×1 IMPLANT
TOWEL OR 17X24 6PK STRL BLUE (TOWEL DISPOSABLE) ×4 IMPLANT
TRAY FOLEY CATH 14FR (SET/KITS/TRAYS/PACK) IMPLANT
WATER STERILE IRR 1000ML POUR (IV SOLUTION) IMPLANT

## 2011-07-01 NOTE — Anesthesia Postprocedure Evaluation (Signed)
  Anesthesia Post-op Note  Patient: Meagan Austin  Procedure(s) Performed:  CESAREAN SECTION  Patient Location: PACU  Anesthesia Type: Epidural  Level of Consciousness: awake, alert , oriented and patient cooperative  Airway and Oxygen Therapy: Patient Spontanous Breathing  Post-op Pain: none  Post-op Assessment: Post-op Vital signs reviewed  Post-op Vital Signs: Reviewed and stable  Complications: No apparent anesthesia complications

## 2011-07-01 NOTE — Transfer of Care (Signed)
Immediate Anesthesia Transfer of Care Note  Patient: Meagan Austin  Procedure(s) Performed:  CESAREAN SECTION  Patient Location: PACU  Anesthesia Type: Epidural  Level of Consciousness: Awake  Airway & Oxygen Therapy: None  Post-op Assessment: Patient stable, report given  Post vital signs: Stable  Complications: None

## 2011-07-01 NOTE — Brief Op Note (Signed)
06/30/2011 - 07/01/2011  5:44 AM  PATIENT:  Meagan Austin  20 y.o. female  PRE-OPERATIVE DIAGNOSIS:  Arrest of active phase, nonreasuring FHTs  POST-OPERATIVE DIAGNOSIS:  same  PROCEDURE:  Procedure(s): CESAREAN SECTION  SURGEON:  Surgeon(s): Wilsie Kern A Phillips Goulette, MD :   ASSISTANTS: none   ANESTHESIA:   epidural  EBL:  Total I/O In: 2348.9 [I.V.:2348.9] Out: 1700 [Urine:550; Blood:1150]  SPECIMEN:  No Specimen  DISPOSITION OF SPECIMEN:  N/A  COUNTS:  YES  DICTATION: see below.  PLAN OF CARE: Admit to inpatient   PATIENT DISPOSITION:  PACU - hemodynamically stable.    Delay start of Pharmacological VTE agent (>24hrs) due to surgical blood loss or risk of bleeding: NOT APPLICABLE:20182 Complications:  none Medications:  Ancef, Pitocin Findings:  Baby female, Apgars 8,9, weight 9#4.   Normal tubes, ovaries and uterus seen.  Technique:  After adequate epidural anesthesia was achieved, the patient was prepped and draped in usual sterile fashion.  A foley catheter was used to drain the bladder.  A pfannanstiel incision was made with the scalpel and carried down to the fascia with the bovie cautery. The fascia was incised in the midline with the scalpel and carried in a transverse curvilinear manner bilaterally.  The fascia was reflected superiorly and inferiorly off the rectus muscles and the muscles split in the midline.  A bowel free portion of the peritoneum was entered bluntly and then extended in a superior and inferior manner with good visualization of the bowel and bladder.  The Alexis instrument was then placed and the vesico-uterine fascia tented up and incised in a transverse curvilinear manner.  A 2 cm incision was made in the upper portion of the lower uterine segment until the amnion was exposed.  Clear fluid was noted and the baby delivered in the vertex OP presentation without complication.  The baby was bulb suctioned and the cord was clamped and cut.  The baby  was then handed to awaiting Neonatology.  The placenta was then delivered manually and the uterus cleared of all debris.  The uterine incision was then closed with a running lock stitch of 0 monocryl.  An imbricating layer of 0 monocryl was closed as well. Good hemostasis of the uterine incision was achieved, the abdomen was cleared with irrigation and the peritoneum was closed with a running stitch of 2-0 vicryl.  This incorporated the rectus muscles as a separate layer.  The fascia was then closed with a running stitch of 0 vicryl.  The subcutaneous layer was closed with interrupted  stitches of 2-0 plain gut.  The skin was closed with staples.  The patient tolerated the procedure well and was returned to the recovery room in stable condition.  All counts were correct times three.  Rashawn Rayman A    

## 2011-07-01 NOTE — Anesthesia Postprocedure Evaluation (Signed)
Anesthesia Post Note  Patient: Meagan Austin  Procedure(s) Performed:  CESAREAN SECTION  Anesthesia type: Epidural  Patient location: PACU  Post pain: Pain level controlled  Post assessment: Post-op Vital signs reviewed  Last Vitals:  Filed Vitals:   07/01/11 0615  BP: 94/40  Pulse: 115  Temp:   Resp: 20    Post vital signs: Reviewed  Level of consciousness: awake  Complications: No apparent anesthesia complications

## 2011-07-01 NOTE — Progress Notes (Signed)
Pt has labored down for 2 hours.    FHTs  150s, gstv, gltv, two subtle late decels seen. Toco q4 SVE by myself- still anterior lip/C/-2; OP- unable to rotate  A/P  Pt has been 9 to anterior lip with pitocin since 10 pm.  FHTs beginning to not be reassuring.  D/W pt R/B/Alt- for C/S for arrest of active phase and NRFHTs.  Jovanni Rash A

## 2011-07-01 NOTE — Progress Notes (Signed)
Dr. Henderson Cloud phoned and notified of pushing results.  Also notified of anesthesia bolus dosing of epidural due to ineffective pain control.  Patient to labor down for a while and then nurse to call Dr. Henderson Cloud after 1 hour of continued pushing again.

## 2011-07-01 NOTE — Op Note (Signed)
06/30/2011 - 07/01/2011  5:44 AM  PATIENT:  Meagan Austin  20 y.o. female  PRE-OPERATIVE DIAGNOSIS:  Arrest of active phase, nonreasuring FHTs  POST-OPERATIVE DIAGNOSIS:  same  PROCEDURE:  Procedure(s): CESAREAN SECTION  SURGEON:  Surgeon(s): Loney Laurence, MD :   ASSISTANTS: none   ANESTHESIA:   epidural  EBL:  Total I/O In: 2348.9 [I.V.:2348.9] Out: 1700 [Urine:550; Blood:1150]  SPECIMEN:  No Specimen  DISPOSITION OF SPECIMEN:  N/A  COUNTS:  YES  DICTATION: see below.  PLAN OF CARE: Admit to inpatient   PATIENT DISPOSITION:  PACU - hemodynamically stable.    Delay start of Pharmacological VTE agent (>24hrs) due to surgical blood loss or risk of bleeding: NOT APPLICABLE:20182 Complications:  none Medications:  Ancef, Pitocin Findings:  Baby female, Apgars 8,9, weight 9#4.   Normal tubes, ovaries and uterus seen.  Technique:  After adequate epidural anesthesia was achieved, the patient was prepped and draped in usual sterile fashion.  A foley catheter was used to drain the bladder.  A pfannanstiel incision was made with the scalpel and carried down to the fascia with the bovie cautery. The fascia was incised in the midline with the scalpel and carried in a transverse curvilinear manner bilaterally.  The fascia was reflected superiorly and inferiorly off the rectus muscles and the muscles split in the midline.  A bowel free portion of the peritoneum was entered bluntly and then extended in a superior and inferior manner with good visualization of the bowel and bladder.  The Alexis instrument was then placed and the vesico-uterine fascia tented up and incised in a transverse curvilinear manner.  A 2 cm incision was made in the upper portion of the lower uterine segment until the amnion was exposed.  Clear fluid was noted and the baby delivered in the vertex OP presentation without complication.  The baby was bulb suctioned and the cord was clamped and cut.  The baby  was then handed to awaiting Neonatology.  The placenta was then delivered manually and the uterus cleared of all debris.  The uterine incision was then closed with a running lock stitch of 0 monocryl.  An imbricating layer of 0 monocryl was closed as well. Good hemostasis of the uterine incision was achieved, the abdomen was cleared with irrigation and the peritoneum was closed with a running stitch of 2-0 vicryl.  This incorporated the rectus muscles as a separate layer.  The fascia was then closed with a running stitch of 0 vicryl.  The subcutaneous layer was closed with interrupted  stitches of 2-0 plain gut.  The skin was closed with staples.  The patient tolerated the procedure well and was returned to the recovery room in stable condition.  All counts were correct times three.  Zenora Karpel A

## 2011-07-01 NOTE — Anesthesia Postprocedure Evaluation (Signed)
  Anesthesia Post-op Note  Patient: Meagan Austin  Procedure(s) Performed:  CESAREAN SECTION  Patient Location: Mother/Baby  Anesthesia Type: Epidural  Level of Consciousness: awake, alert  and oriented  Airway and Oxygen Therapy: Patient Spontanous Breathing  Post-op Assessment: Patient's Cardiovascular Status Stable and Respiratory Function Stable  Post-op Vital Signs: Reviewed and stable  Complications: No apparent anesthesia complications

## 2011-07-01 NOTE — Addendum Note (Signed)
Addendum  created 07/01/11 1412 by Edison Pace, CRNA   Modules edited:Notes Section

## 2011-07-02 LAB — CBC
HCT: 19.6 % — ABNORMAL LOW (ref 36.0–46.0)
MCV: 90.3 fL (ref 78.0–100.0)
Platelets: 151 10*3/uL (ref 150–400)
RBC: 2.17 MIL/uL — ABNORMAL LOW (ref 3.87–5.11)
WBC: 11.6 10*3/uL — ABNORMAL HIGH (ref 4.0–10.5)

## 2011-07-02 NOTE — Progress Notes (Addendum)
MD on call notified of critical lab value, no new orders received.  Patient resting in bed.  MD told vital sign ranges throughout the shift.

## 2011-07-02 NOTE — Progress Notes (Signed)
POD#1 Pt without c/o. Has no symptoms of anemia. VSSAF IMP/ stable Plan/ start iron.

## 2011-07-02 NOTE — Progress Notes (Signed)
CRITICAL VALUE ALERT  Critical value received:  hgb 6.5  Date of notification:  07/02/11  Time of notification:  0556  Critical value read back: Yes  Nurse who received alert:  Billey Co RN  MD notified (1st page):  Dr. Dareen Piano  Time of first page:  0628  MD notified (2nd page):  Time of second page:  Responding MD:  Dr. Dareen Piano  Time MD responded:  [phone]

## 2011-07-03 MED ORDER — FERROUS SULFATE 325 (65 FE) MG PO TABS: 325.0000 mg | ORAL_TABLET | Freq: Two times a day (BID) | ORAL | Status: DC

## 2011-07-03 MED ORDER — OXYCODONE-ACETAMINOPHEN 5-325 MG PO TABS: 1.0000 | ORAL_TABLET | ORAL | Status: AC | PRN

## 2011-07-03 NOTE — Discharge Summary (Signed)
[name], Meagan Austin               ACCOUNT NO.:  [id number]  MEDICAL RECORD NO.:  [id number]  LOCATION:  9106                          FACILITY:  [facility]  PHYSICIAN:  Malva Limes, M.D.    DATE OF BIRTH:  [birth year]  DATE OF ADMISSION:  06/30/2011 DATE OF DISCHARGE:  07/03/2011                              DISCHARGE SUMMARY   PRINCIPAL DISCHARGE DIAGNOSES: 1. Intrauterine pregnancy at term. 2. Nonreassuring fetal heart rate tracing. 3. Arrest of active phase.  PRINCIPAL PROCEDURES: 1. Elective induction. 2. Primary low transverse cesarean section.  HISTORY OF PRESENT ILLNESS:  [name] is a 20 year old, white female, G1, P0, who was admitted on June 30, 2011, for an elective induction secondary to advanced cervical dilation.  Prenatal care was uncomplicated.  HOSPITAL COURSE:  The patient had an induction performed by Dr. Carrington Clamp.  The patient progressed to complete anterior lip and -2 station.  Baby was noted to be OP at that time and attempted rotation was unsuccessful.  She was felt to have a nonreassuring fetal heart rate tracing secondary to late decelerations and had made no change in her exam.  At that point, Dr. Henderson Cloud took her to the operating room for a primary cesarean section.  Complete description of this can be found in dictated operative note.  The patient's postop course was benign.  She expressed desire to go home on postop day #2.  The patient did have significant anemia after the surgery.  Her hemoglobin was 6.5, however, she had absolutely no symptoms.  At the time of discharge, she was ambulating without difficulty, eating regular diet and her incision appeared to be healing well.  The patient was sent home with Percocet, iron and prenatal vitamins.  She was told to follow up in the office in 4 weeks.          ______________________________ Malva Limes, M.D.     MA/MEDQ  D:  07/03/2011  T:  07/03/2011  Job:  [id number]

## 2011-07-03 NOTE — Progress Notes (Signed)
POD#2 Pt without c/o. Would like to go home early. Not symptomatic from anemia. VSSAF Imp/ stable. Plan/ discharge.

## 2011-07-06 ENCOUNTER — Encounter (HOSPITAL_COMMUNITY): Payer: Self-pay | Admitting: Obstetrics and Gynecology

## 2013-08-16 IMAGING — US US FETAL BPP W/O NONSTRESS
1 series · 13 of 28 positions shown · non-contrast
Comparison: none

[Series 1: us fetal bpp w/o nonstress · non-contrast · 32 acquisitions, 13 frames shown]
[im 2/32]
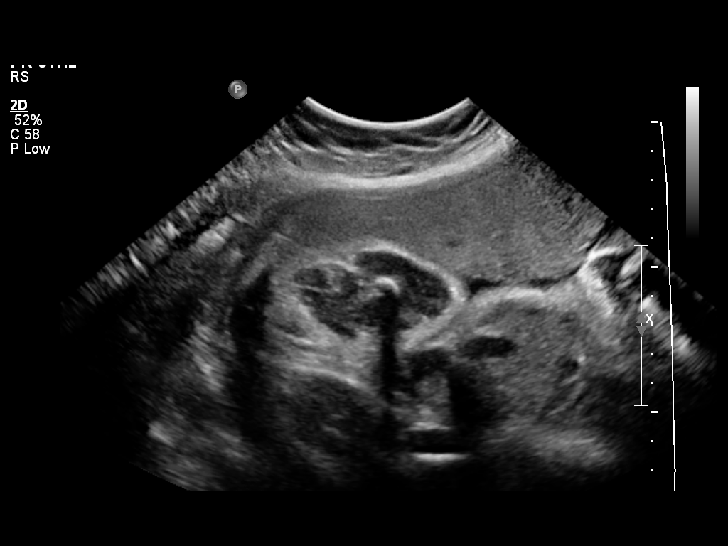
[im 4/32]
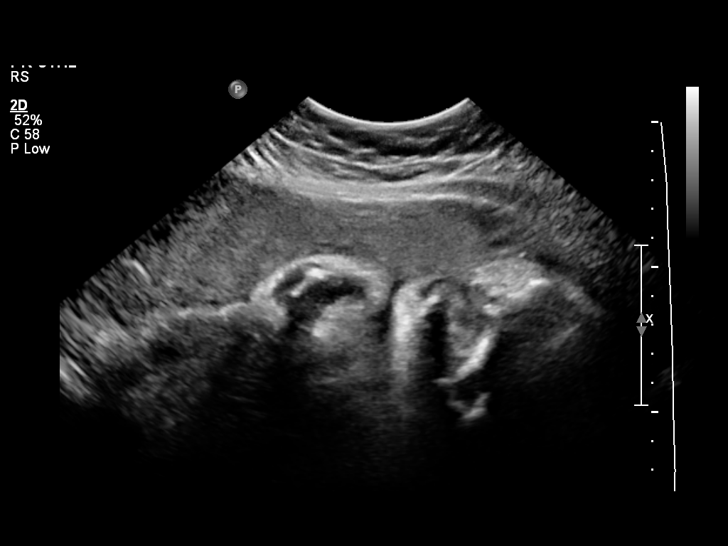
[im 6/32]
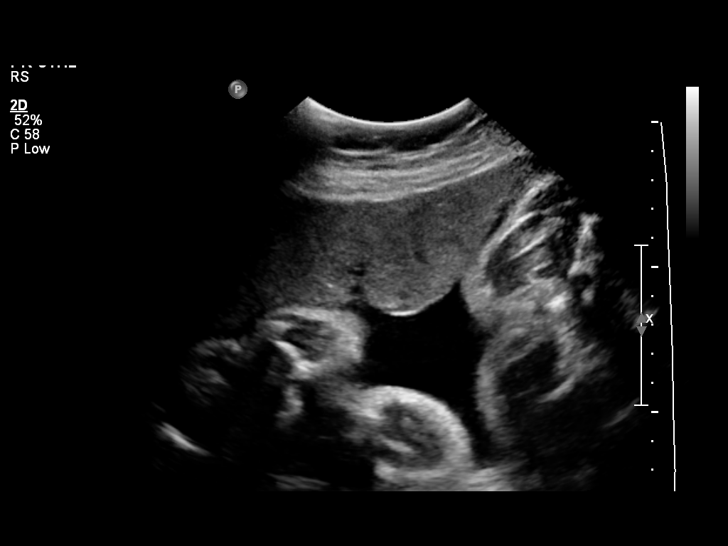
[im 9/32]
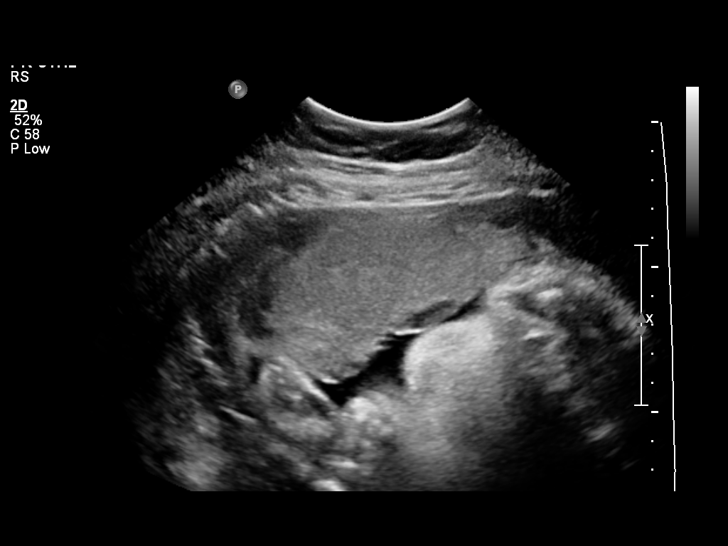
[im 11/32]
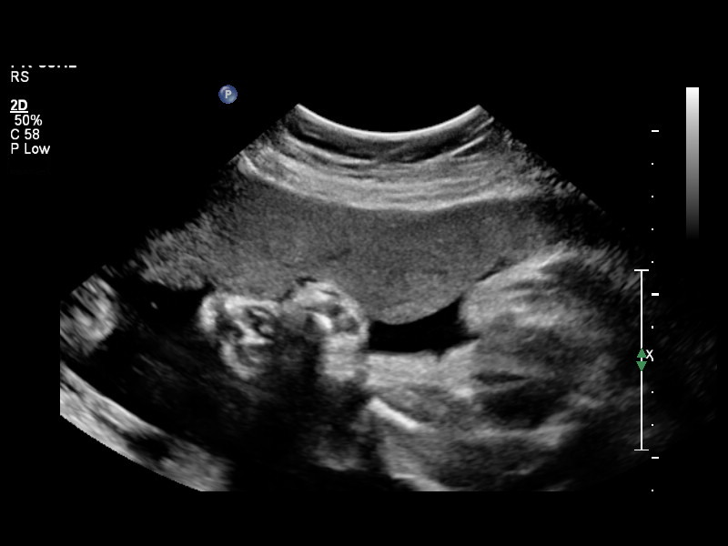
[im 13/32]
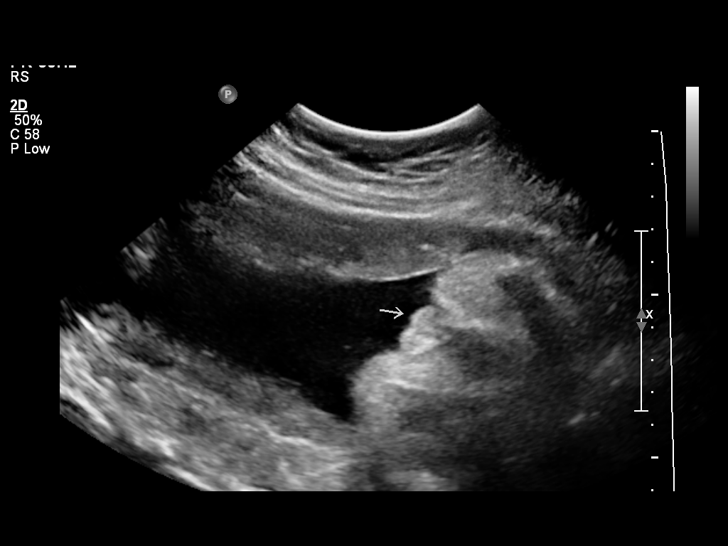
[im 17/32]
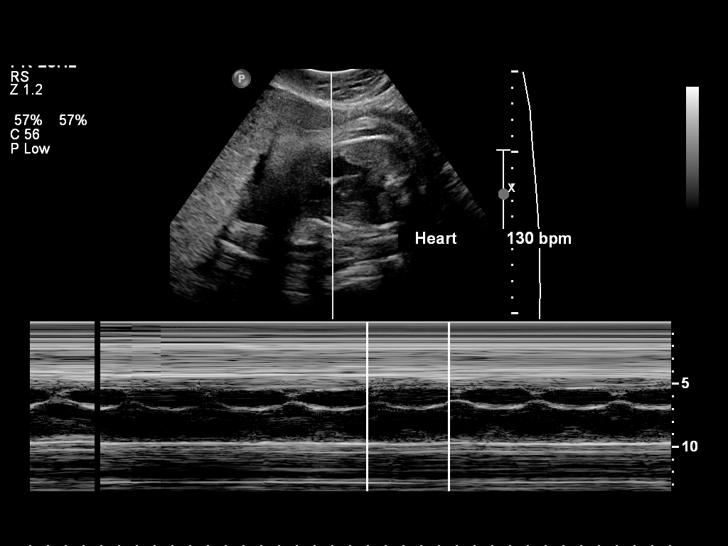
[im 19/32]
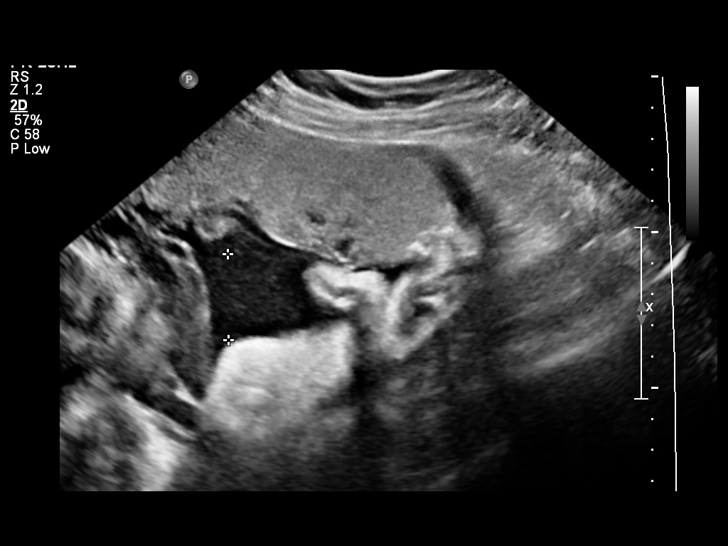
[im 21/32]
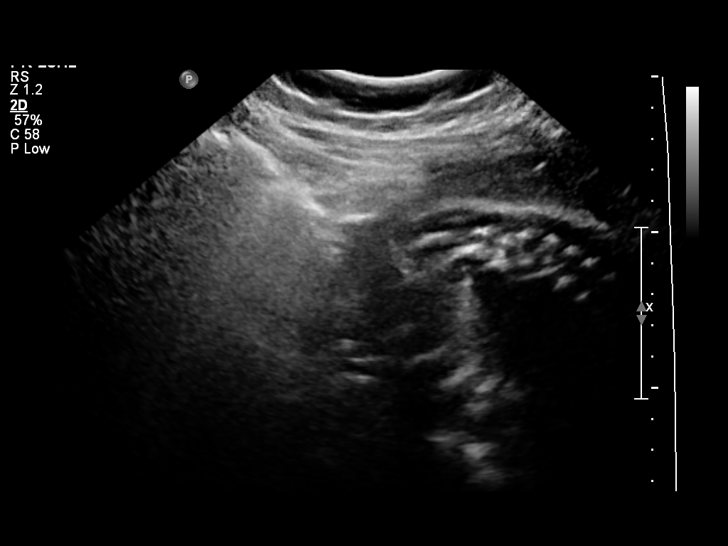
[im 23/32]
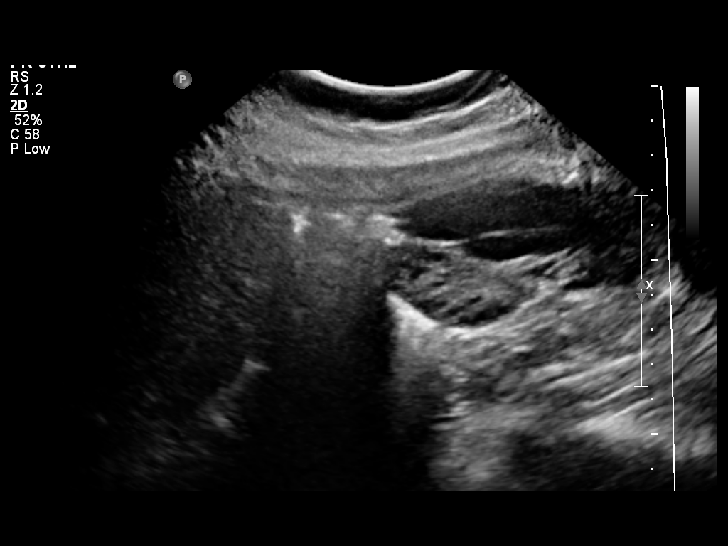
[im 26/32]
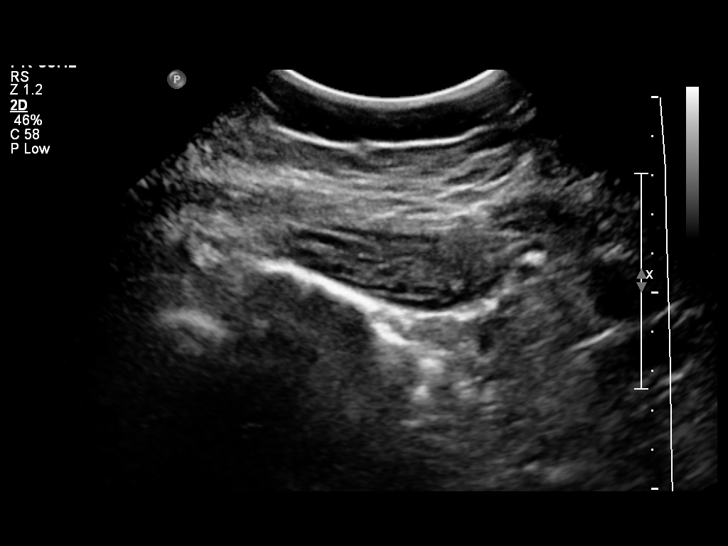
[im 28/32]
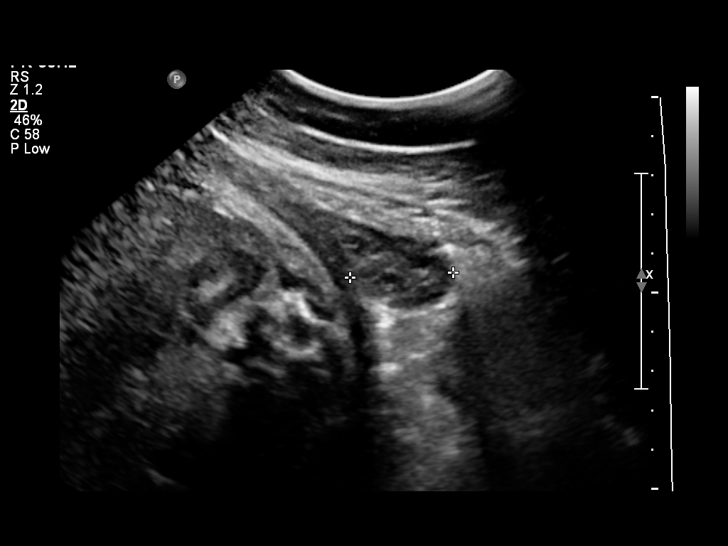
[im 30/32]
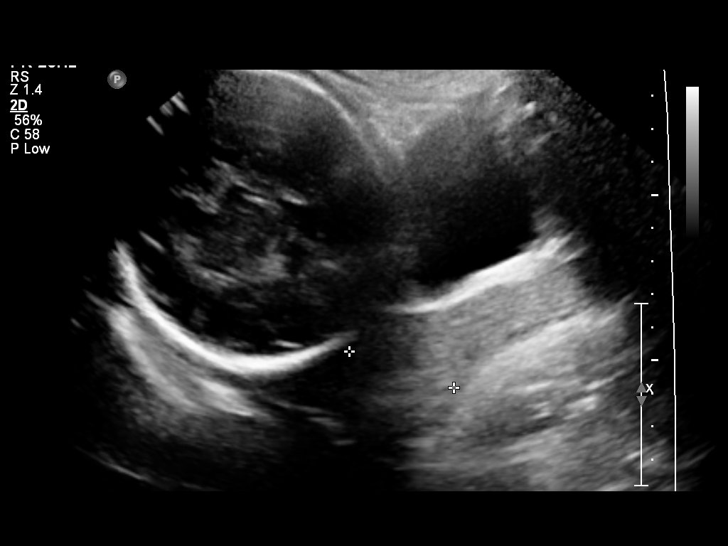

[13 of 28 positions shown; findings below may reference images not displayed]

OBSTETRICS REPORT
                      (Signed Final 05/12/2011 [DATE])

                 8_E
Procedures

 [HOSPITAL]                                         76815.0
Indications

 Decreased fetal movement
 Assess amniotic fluid volume
Fetal Evaluation

 Fetal Heart Rate:  130                          bpm
 Cardiac Activity:  Observed
 Presentation:      Cephalic
 Placenta:          Anterior, above cervical os

 Amniotic Fluid
 AFI FV:      Subjectively within normal limits
 AFI Sum:     12.74   cm       38  %Tile      Larg Pckt:    5.7  cm
 RUQ:   5.7     cm   LUQ:    4.28   cm    LLQ:   2.76    cm
Biophysical Evaluation

 Amniotic F.V:   Pocket => 2 cm two         F. Tone:         Observed
                 planes
 F. Movement:    Observed                   Score:           [DATE]
 F. Breathing:   Observed
Gestational Age

 Clinical EDD:  32w 3d                                        EDD:   07/04/11
 Best:          32w 3d     Det. By:  Clinical EDD             EDD:   07/04/11
Cervix Uterus Adnexa

 Cervical Length:    3.2       cm

 Cervix:       Normal appearance by transabdominal scan.

 Left Ovary:    Within normal limits.
 Right Ovary:   Within normal limits.
 Adnexa:     No abnormality visualized.
Impression

 BPP [DATE].

 Subjectively and quantiatively normal amniotic fluid volume
 with a single pocket measuring > 2 x 2 cm.

 Normal cervical length and appearance.

 questions or concerns.

## 2014-05-11 ENCOUNTER — Encounter (HOSPITAL_COMMUNITY): Payer: Self-pay | Admitting: Obstetrics and Gynecology

## 2015-07-11 NOTE — L&D Delivery Note (Signed)
Delivery Note  First Stage: Labor onset: 0700 Augmentation : pitocin Analgesia /Anesthesia intrapartum: epidural SROM at 0700  Second Stage: Complete dilation at 0445 Onset of pushing at 0504 FHR second stage category 2  Delivery of a viable female at [id number] by CNM in LOA position Loose nuchal cord x1 / noted single false knot Cord double clamped after cessation of pulsation, cut by FOB Cord blood sample collected    Third Stage: [facility] with 3 VC @ 0539 Placenta disposition: hospital disposal Uterine tone firm / bleeding brisk with identified perineal bleeder secured with single suture for hemostasis  2nd laceration identified  Anesthesia for repair: epidural Repair 3-0 vicryl perineal and small vaginal and 4-0 subcuticular for perineal skin and small left labial extension Est. Blood Loss (mL): 500  Complications: none  Mom to postpartum.  Baby to Couplet care / Skin to Skin.    Marlinda MikeBAILEY, Keola Heninger CNM, MSN, [facility]unkieFACNM 06/22/2016, 6:13 AM

## 2015-11-05 LAB — OB RESULTS CONSOLE RPR: RPR: NONREACTIVE

## 2015-11-05 LAB — OB RESULTS CONSOLE GC/CHLAMYDIA
Chlamydia: NEGATIVE
Chlamydia: NEGATIVE
Gonorrhea: NEGATIVE
Gonorrhea: NEGATIVE

## 2015-11-05 LAB — OB RESULTS CONSOLE ABO/RH

## 2015-11-05 LAB — OB RESULTS CONSOLE HEPATITIS B SURFACE ANTIGEN: Hepatitis B Surface Ag: NEGATIVE

## 2015-11-05 LAB — OB RESULTS CONSOLE RUBELLA ANTIBODY, IGM: RUBELLA: IMMUNE

## 2015-11-05 LAB — OB RESULTS CONSOLE ANTIBODY SCREEN: ANTIBODY SCREEN: NEGATIVE

## 2015-11-05 LAB — OB RESULTS CONSOLE HIV ANTIBODY (ROUTINE TESTING): HIV: NONREACTIVE

## 2015-11-18 ENCOUNTER — Encounter (HOSPITAL_COMMUNITY): Payer: Self-pay | Admitting: *Deleted

## 2015-11-18 ENCOUNTER — Inpatient Hospital Stay (HOSPITAL_COMMUNITY): Payer: BC Managed Care – PPO

## 2015-11-18 ENCOUNTER — Inpatient Hospital Stay (HOSPITAL_COMMUNITY)
Admission: AD | Admit: 2015-11-18 | Discharge: 2015-11-18 | Disposition: A | Discharge status: Home / Self Care | Payer: BC Managed Care – PPO | Source: Ambulatory Visit | Attending: Obstetrics & Gynecology | Admitting: Obstetrics & Gynecology

## 2015-11-18 DIAGNOSIS — O200 Threatened abortion: Secondary | ICD-10-CM | POA: Insufficient documentation

## 2015-11-18 DIAGNOSIS — K589 Irritable bowel syndrome without diarrhea: Secondary | ICD-10-CM | POA: Diagnosis not present

## 2015-11-18 DIAGNOSIS — Z3A01 Less than 8 weeks gestation of pregnancy: Secondary | ICD-10-CM | POA: Diagnosis not present

## 2015-11-18 DIAGNOSIS — O209 Hemorrhage in early pregnancy, unspecified: Secondary | ICD-10-CM

## 2015-11-18 DIAGNOSIS — Z91040 Latex allergy status: Secondary | ICD-10-CM | POA: Diagnosis not present

## 2015-11-18 LAB — HCG, QUANTITATIVE, PREGNANCY: hCG, Beta Chain, Quant, S: 22281 m[IU]/mL — ABNORMAL HIGH (ref <5)

## 2015-11-18 MED ORDER — PROGESTERONE MICRONIZED 200 MG PO CAPS: 200.0000 mg | ORAL_CAPSULE | Freq: Every day | ORAL | Status: DC

## 2015-11-18 NOTE — MAU Note (Signed)
Pt stated she stared having a small amount of bleding today at work. Deneis pain or cramping at this time. Was told to come in by her provider.

## 2015-11-18 NOTE — Discharge Instructions (Signed)
Vaginal Bleeding During Pregnancy, First Trimester A small amount of bleeding (spotting) from the vagina is common in early pregnancy. Sometimes the bleeding is normal and is not a problem, and sometimes it is a sign of something serious. Be sure to tell your doctor about any bleeding from your vagina right away. HOME CARE  Watch your condition for any changes.  Follow your doctor's instructions about how active you can be.  Do not use tampons.  Do not douche.  Do not have sex or orgasms until your doctor says it is okay.  If you pass any tissue from your vagina, save the tissue so you can show it to your doctor.  Only take medicines as told by your doctor.  Keep all follow-up visits as told by your doctor. GET HELP IF:   You bleed heavy bright red from your vagina.  You have cramps or intense pain.  You have labor pains.  You have a fever that does not go away after you take medicine.  You pass large clots or tissue from your vagina.  You feel light-headed or weak.  You pass out (faint).  You have chills. MAKE SURE YOU:  Understand these instructions.  Will watch your condition.  Will get help right away if you are not doing well or get worse.   This information is not intended to replace advice given to you by your health care provider. Make sure you discuss any questions you have with your health care provider.   Document Released: 11/10/2013 Document Reviewed: 11/10/2013 Elsevier Interactive Patient Education [employer]2016 Elsevier Inc.

## 2015-11-18 NOTE — MAU Provider Note (Signed)
  History     CSN: [id number]  Arrival date and time: 11/18/15 1834 Orders placed @ 1840  CNM to see patient - provider here waiting for room availability for patient @ 1940   HPI  Presenting concern - bright red bleeding / early pregnant No cramps or pain Red bleeding this afternoon @ 3pm  LMP 10/05/2015 with EDD of 07-12-2016 with EGA of [redacted] weeks  Past Medical History  Diagnosis Date  . Irritable bowel syndrome (IBS) 06-29-11    Bilat ear infections, finished  Amoxicillin last weekend (06-24-11)  . Preterm labor   . Neuromuscular disorder     R side Bell's Palsy  . No pertinent past medical history    Past surgeries Cholecystectomy Cesarean section  No family history on file.  Social History  Substance Use Topics  . Smoking status: Never Smoker   . Smokeless tobacco: Never Used  . Alcohol Use: No   Allergies:  Allergies  Allergen Reactions  . Food Other (See Comments)    Pear-childhood reaction  . Latex Rash   Prescriptions prior to admission  Medication Sig Dispense Refill Last Dose  . ferrous sulfate 325 (65 FE) MG tablet Take 1 tablet (325 mg total) by mouth 2 (two) times daily with a meal. 60 tablet 1    ROS Physical Exam   Small red spotting Abdomen soft and non-tender Defer pelvic   Physical Exam Results for Sharon MtHAYES, Brettany (MRN [id number]) as of 11/18/2015 20:29  Ref. Range 11/18/2015 19:00  HCG, Beta Chain, Quant, S Latest Ref Range: <5 mIU/mL 22281 (H)   MAU Course  Procedures  Ultrasound IUP at 6 week - FHR 102 / small SCH   Assessment and Plan  6 weeks SCH Threatened AB  1) dc home 2) progesterone 200mg  orally  3) repeat sono at [facility] in 1 week 4) miscarriage precautions / rest at home - no sex / no exercise / complete pelvic rest x 1 week  Marlinda MikeBAILEY, Jeremiyah Cullens 11/18/2015, 6:40 PM

## 2015-11-20 LAB — PROGESTERONE: Progesterone: 11.7 ng/mL

## 2016-05-24 LAB — OB RESULTS CONSOLE GBS: STREP GROUP B AG: NEGATIVE

## 2016-06-21 ENCOUNTER — Inpatient Hospital Stay (HOSPITAL_COMMUNITY)
Admission: AD | Admit: 2016-06-21 | Discharge: 2016-06-24 | DRG: 775 | Disposition: A | Discharge status: Home / Self Care | Payer: BC Managed Care – PPO | Source: Ambulatory Visit | Attending: Obstetrics and Gynecology | Admitting: Obstetrics and Gynecology

## 2016-06-21 ENCOUNTER — Encounter (HOSPITAL_COMMUNITY): Payer: Self-pay | Admitting: *Deleted

## 2016-06-21 DIAGNOSIS — O34211 Maternal care for low transverse scar from previous cesarean delivery: Secondary | ICD-10-CM | POA: Diagnosis present

## 2016-06-21 DIAGNOSIS — O34219 Maternal care for unspecified type scar from previous cesarean delivery: Secondary | ICD-10-CM | POA: Diagnosis not present

## 2016-06-21 DIAGNOSIS — O4202 Full-term premature rupture of membranes, onset of labor within 24 hours of rupture: Secondary | ICD-10-CM | POA: Diagnosis present

## 2016-06-21 DIAGNOSIS — Z3A37 37 weeks gestation of pregnancy: Secondary | ICD-10-CM

## 2016-06-21 DIAGNOSIS — D62 Acute posthemorrhagic anemia: Secondary | ICD-10-CM | POA: Diagnosis not present

## 2016-06-21 DIAGNOSIS — O9081 Anemia of the puerperium: Secondary | ICD-10-CM | POA: Diagnosis not present

## 2016-06-21 DIAGNOSIS — IMO0002 Reserved for concepts with insufficient information to code with codable children: Secondary | ICD-10-CM | POA: Diagnosis present

## 2016-06-21 HISTORY — DX: Bell's palsy: G51.0

## 2016-06-21 LAB — TYPE AND SCREEN
ABO/RH(D): O POS
Antibody Screen: NEGATIVE

## 2016-06-21 LAB — CBC
HCT: 37.2 % (ref 36.0–46.0)
Hemoglobin: 12.5 g/dL (ref 12.0–15.0)
MCH: 30.9 pg (ref 26.0–34.0)
MCHC: 33.6 g/dL (ref 30.0–36.0)
MCV: 91.9 fL (ref 78.0–100.0)
Platelets: 185 10*3/uL (ref 150–400)
RBC: 4.05 MIL/uL (ref 3.87–5.11)
RDW: 15.6 % — ABNORMAL HIGH (ref 11.5–15.5)
WBC: 8.9 10*3/uL (ref 4.0–10.5)

## 2016-06-21 LAB — GROUP B STREP BY PCR: Group B strep by PCR: NEGATIVE

## 2016-06-21 LAB — OB RESULTS CONSOLE GBS: STREP GROUP B AG: NEGATIVE

## 2016-06-21 MED ORDER — OXYCODONE-ACETAMINOPHEN 5-325 MG PO TABS
2.0000 | ORAL_TABLET | ORAL | Status: DC | PRN
Start: 2016-06-21 — End: 2016-06-22

## 2016-06-21 MED ORDER — LACTATED RINGERS IV SOLN: 500.0000 mL | INTRAVENOUS | Status: DC | PRN

## 2016-06-21 MED ORDER — OXYTOCIN 40 UNITS IN LACTATED RINGERS INFUSION - SIMPLE MED
2.5000 [IU]/h | INTRAVENOUS | Status: DC
  Filled 2016-06-21: qty 1000

## 2016-06-21 MED ORDER — OXYTOCIN BOLUS FROM INFUSION
500.0000 mL | Freq: Once | INTRAVENOUS | Status: AC
  Administered 2016-06-22: 500 mL via INTRAVENOUS

## 2016-06-21 MED ORDER — LACTATED RINGERS IV SOLN
INTRAVENOUS | Status: DC
  Administered 2016-06-21 – 2016-06-22 (×2): via INTRAVENOUS

## 2016-06-21 MED ORDER — OXYCODONE-ACETAMINOPHEN 5-325 MG PO TABS: 1.0000 | ORAL_TABLET | ORAL | Status: DC | PRN

## 2016-06-21 MED ORDER — OXYTOCIN 40 UNITS IN LACTATED RINGERS INFUSION - SIMPLE MED
1.0000 m[IU]/min | INTRAVENOUS | Status: DC
  Administered 2016-06-21: 2 m[IU]/min via INTRAVENOUS

## 2016-06-21 MED ORDER — SOD CITRATE-CITRIC ACID 500-334 MG/5ML PO SOLN: 30.0000 mL | ORAL | Status: DC | PRN

## 2016-06-21 MED ORDER — TERBUTALINE SULFATE 1 MG/ML IJ SOLN
0.2500 mg | Freq: Once | INTRAMUSCULAR | Status: DC | PRN
  Filled 2016-06-21: qty 1

## 2016-06-21 MED ORDER — ACETAMINOPHEN 325 MG PO TABS: 650.0000 mg | ORAL_TABLET | ORAL | Status: DC | PRN

## 2016-06-21 MED ORDER — LIDOCAINE HCL (PF) 1 % IJ SOLN
30.0000 mL | INTRAMUSCULAR | Status: DC | PRN
  Filled 2016-06-21: qty 30

## 2016-06-21 NOTE — MAU Note (Signed)
Urine in lab 

## 2016-06-21 NOTE — Progress Notes (Signed)
S:  feels like nothing is happening        anxious and arguing with family and spouse intermittently  O:  VS: Blood pressure 129/74, pulse (!) 115, temperature 97.7 F (36.5 C), temperature source Oral, resp. rate 16, height 5' (1.524 m), weight 76.2 kg (168 lb), last menstrual period 10/05/2015, SpO2 95 %, unknown if currently breastfeeding.        FHR : baseline 135 / variability moderate / accelerations + / no decelerations        Toco: contractions every 8-10 minutes / mild         Cervix : unchanged @4cm  / 80% vtx -2        Membranes: clear        IUPC placed  A: prolonged latent labor     FHR category 1  P: pitocin augmentation        Dr Billy Coastaavon updated in past hour   Marlinda MikeBAILEY, [place] CNM, MSN, [facility] 06/21/2016, 8:35 PM

## 2016-06-21 NOTE — Anesthesia Pain Management Evaluation Note (Signed)
  CRNA Pain Management Visit Note  Patient: Meagan Austin, 25 y.o., female  "Hello I am a [facility]. We have an anesthesia team available at all times to provide care throughout the hospital, including epidural management and anesthesia for C-section. I don't know your plan for the delivery whether it a natural birth, water birth, IV sedation, nitrous supplementation, doula or epidural, but we want to meet your pain goals."   1.Was your pain managed to your expectations on prior hospitalizations?   Yes   2.What is your expectation for pain management during this hospitalization?     Labor support without medications  3.How can we help you reach that goal? Patient wishes to attempt a natural childbirth.  Options discussed with patient regarding pain control options, all questions answered.  Record the patient's initial score and the patient's pain goal.   Pain: 1-3  Pain Goal: 7-8 [facility] wants you to be able to say your pain was always managed very well.  Meagan Austin L 06/21/2016

## 2016-06-21 NOTE — H&P (Signed)
  OB ADMISSION/ HISTORY & PHYSICAL:  Admission Date: 06/21/2016 10:38 AM  Admit Diagnosis: 37.1 weeks / SROM / latent labor / previous c-section / desires TOLAC  Meagan Austin is a 25 y.o. female presenting for SROM - 0700 with mild ctx.  Prenatal History: G2P1001   EDC : 07/11/2016, Alternate EDD Entry  [facility] Provider: Fredric MareBailey Prenatal course complicated by previous CS   Medical / Surgical History :  Past medical history:  Past Medical History:  Diagnosis Date  . Irritable bowel syndrome (IBS) 06-29-11   Bilat ear infections, finished  Amoxicillin last weekend (06-24-11)  . Neuromuscular disorder (HCC)    R side Bell's Palsy  . No pertinent past medical history   . Preterm labor     Past surgical history:  Past Surgical History:  Procedure Laterality Date  . CESAREAN SECTION  07/01/2011   Procedure: CESAREAN SECTION;  Surgeon: Loney LaurenceMichelle A Horvath, MD;  Location: [facility];  Service: Gynecology;  Laterality: N/A;  . CHOLECYSTECTOMY    . CHOLECYSTECTOMY     2011  . NO PAST SURGERIES     Family History: No family history on file.   Social History:  reports that she has never smoked. She has never used smokeless tobacco. She reports that she does not drink alcohol or use drugs.  Allergies: Food and Latex   Current Medications at time of admission:  Prior to Admission medications   Medication Sig Start Date End Date Taking? Authorizing Provider  progesterone (PROMETRIUM) 200 MG capsule Take 1 capsule (200 mg total) by mouth at bedtime. 11/18/15 11/17/16  Marlinda Mikeanya Arihanna Estabrook, CNM   Review of Systems: Active FM onset of ctx @ 0700 currently every 5 minutes LOF  / SROM @ 0700 bloody show prsent  Physical Exam:  VS: Blood pressure 111/63, pulse 100, temperature 98.1 F (36.7 C), temperature source Oral, resp. rate 20, last menstrual period 10/05/2015, unknown if currently breastfeeding.  General: alert and oriented, appears mildly  uncomfortable Heart: RRR Lungs: Clear lung fields Abdomen: Gravid, soft and non-tender, non-distended / uterus: gravid Extremities: trace edema  Genitalia / VE:   posterior / 3cm / 80% / vtx -1 Clear fluid intermittent leakage - fern positive  FHR: baseline rate 145 / variability moderate / accelerations + / no decelerations TOCO: 3-5 mild to moderate  Assessment:  [redacted] weeks gestation - SROM  latent stage of labor FHR category 1 TOLAC   Plan:  Admit Expectant management  Dr Billy Coastaavon notified of admission / plan of care   Marlinda MikeBAILEY, Labrandon Knoch CNM, MSN, [facility] 06/21/2016, 11:43 AM

## 2016-06-21 NOTE — MAU Note (Signed)
Pt C/O contractions all morning, had ? Gush of fluid last night & this morning, unsure if it is amniotic fluid.  Denies bleeding.  Planning TOLAC.

## 2016-06-21 NOTE — Progress Notes (Signed)
S:  asking about water birth option - doula arrived with tub  O:  VS: Blood pressure 111/63, pulse 99, temperature 97.9 F (36.6 C), temperature source Oral, resp. rate 16, height 5' (1.524 m), weight 76.2 kg (168 lb), last menstrual period 10/05/2015, SpO2 95 %, unknown if currently breastfeeding.          FHR : baseline 140 / variability moderate / accelerations + / no decelerations        Toco: contractions every 3-6 minutes / mild         Cervix : 4cm / 80% / vtx -2 with small show        Membranes: forewaters ruptured on exam        Rapid GBS negative                   culture to result tomorrow per [facility]  WB class at 6 weeks - informed in class not candidate for water birth at hospital unless additional arm of research study is added in 2018  Initial OB visit reviewed not candidate for birth center birth or water birth. Able to labor at home in tub in early labor with doula and utilize shower for hydrotherapy in hospital. NO further discussions about water birth since. Labor planning scheduled for next ROB visit with GBS result.  A: latent labor - TOLAC     FHR category 1  P: expectant management     doula and midwife support/ laboring on birth ball / ok shower   Marlinda MikeBAILEY, Charmion Hapke CNM, MSN, FACNM 06/21/2016, 3:12 PM

## 2016-06-22 ENCOUNTER — Encounter (HOSPITAL_COMMUNITY): Payer: Self-pay | Admitting: *Deleted

## 2016-06-22 ENCOUNTER — Inpatient Hospital Stay (HOSPITAL_COMMUNITY): Payer: BC Managed Care – PPO | Admitting: Anesthesiology

## 2016-06-22 DIAGNOSIS — O34219 Maternal care for unspecified type scar from previous cesarean delivery: Secondary | ICD-10-CM | POA: Diagnosis not present

## 2016-06-22 LAB — RPR: RPR Ser Ql: NONREACTIVE

## 2016-06-22 MED ORDER — PHENYLEPHRINE 40 MCG/ML (10ML) SYRINGE FOR IV PUSH (FOR BLOOD PRESSURE SUPPORT)
80.0000 ug | PREFILLED_SYRINGE | INTRAVENOUS | Status: DC | PRN
  Filled 2016-06-22: qty 5

## 2016-06-22 MED ORDER — COCONUT OIL OIL
1.0000 | TOPICAL_OIL | Status: DC | PRN
  Administered 2016-06-23: 1 via TOPICAL
  Filled 2016-06-22: qty 120

## 2016-06-22 MED ORDER — OXYCODONE-ACETAMINOPHEN 5-325 MG PO TABS: 1.0000 | ORAL_TABLET | ORAL | Status: DC | PRN

## 2016-06-22 MED ORDER — EPHEDRINE 5 MG/ML INJ
10.0000 mg | INTRAVENOUS | Status: DC | PRN
  Filled 2016-06-22: qty 4

## 2016-06-22 MED ORDER — BENZOCAINE-MENTHOL 20-0.5 % EX AERO
1.0000 "application " | INHALATION_SPRAY | CUTANEOUS | Status: DC | PRN
  Administered 2016-06-22 – 2016-06-23 (×2): 1 via TOPICAL
  Filled 2016-06-22 (×2): qty 56

## 2016-06-22 MED ORDER — SERTRALINE HCL 50 MG PO TABS
50.0000 mg | ORAL_TABLET | Freq: Every day | ORAL | Status: DC
  Administered 2016-06-22: 50 mg via ORAL
  Filled 2016-06-22 (×4): qty 1

## 2016-06-22 MED ORDER — BUPROPION HCL ER (XL) 300 MG PO TB24
300.0000 mg | ORAL_TABLET | Freq: Every day | ORAL | Status: DC
  Administered 2016-06-22: 300 mg via ORAL
  Filled 2016-06-22 (×4): qty 1

## 2016-06-22 MED ORDER — FENTANYL 2.5 MCG/ML BUPIVACAINE 1/10 % EPIDURAL INFUSION (WH - ANES): 14.0000 mL/h | INTRAMUSCULAR | Status: DC | PRN

## 2016-06-22 MED ORDER — IBUPROFEN 600 MG PO TABS
600.0000 mg | ORAL_TABLET | Freq: Four times a day (QID) | ORAL | Status: DC
  Administered 2016-06-22 – 2016-06-24 (×9): 600 mg via ORAL
  Filled 2016-06-22 (×9): qty 1

## 2016-06-22 MED ORDER — LIDOCAINE HCL (PF) 1 % IJ SOLN
INTRAMUSCULAR | Status: DC | PRN
  Administered 2016-06-22 (×2): 4 mL

## 2016-06-22 MED ORDER — DIBUCAINE 1 % RE OINT: 1.0000 "application " | TOPICAL_OINTMENT | RECTAL | Status: DC | PRN

## 2016-06-22 MED ORDER — WITCH HAZEL-GLYCERIN EX PADS: 1.0000 "application " | MEDICATED_PAD | CUTANEOUS | Status: DC | PRN

## 2016-06-22 MED ORDER — PHENYLEPHRINE 40 MCG/ML (10ML) SYRINGE FOR IV PUSH (FOR BLOOD PRESSURE SUPPORT)
PREFILLED_SYRINGE | INTRAVENOUS | Status: DC
Start: 2016-06-22 — End: 2016-06-22
  Filled 2016-06-22: qty 20

## 2016-06-22 MED ORDER — SIMETHICONE 80 MG PO CHEW: 80.0000 mg | CHEWABLE_TABLET | ORAL | Status: DC | PRN

## 2016-06-22 MED ORDER — ACETAMINOPHEN 325 MG PO TABS
650.0000 mg | ORAL_TABLET | ORAL | Status: DC | PRN
  Administered 2016-06-22 (×2): 650 mg via ORAL
  Filled 2016-06-22 (×3): qty 2

## 2016-06-22 MED ORDER — SENNOSIDES-DOCUSATE SODIUM 8.6-50 MG PO TABS
2.0000 | ORAL_TABLET | ORAL | Status: DC
  Administered 2016-06-23 (×2): 2 via ORAL
  Filled 2016-06-22 (×2): qty 2

## 2016-06-22 MED ORDER — TETANUS-DIPHTH-ACELL PERTUSSIS 5-2.5-18.5 LF-MCG/0.5 IM SUSP
0.5000 mL | Freq: Once | INTRAMUSCULAR | Status: AC
  Administered 2016-06-23: 0.5 mL via INTRAMUSCULAR
  Filled 2016-06-22: qty 0.5

## 2016-06-22 MED ORDER — FENTANYL 2.5 MCG/ML BUPIVACAINE 1/10 % EPIDURAL INFUSION (WH - ANES)
INTRAMUSCULAR | Status: AC
  Filled 2016-06-22: qty 100

## 2016-06-22 MED ORDER — DIPHENHYDRAMINE HCL 50 MG/ML IJ SOLN: 12.5000 mg | INTRAMUSCULAR | Status: DC | PRN

## 2016-06-22 MED ORDER — FENTANYL 2.5 MCG/ML BUPIVACAINE 1/10 % EPIDURAL INFUSION (WH - ANES)
14.0000 mL/h | INTRAMUSCULAR | Status: DC | PRN
  Administered 2016-06-22: 14 mL/h via EPIDURAL

## 2016-06-22 MED ORDER — LACTATED RINGERS IV SOLN
500.0000 mL | Freq: Once | INTRAVENOUS | Status: AC
  Administered 2016-06-22: 500 mL via INTRAVENOUS

## 2016-06-22 MED ORDER — ONDANSETRON HCL 4 MG/2ML IJ SOLN
4.0000 mg | Freq: Once | INTRAMUSCULAR | Status: AC
  Administered 2016-06-22: 4 mg via INTRAVENOUS
  Filled 2016-06-22: qty 2

## 2016-06-22 NOTE — Progress Notes (Signed)
INTERVAL NOTE:  S:  Standing at BS, min cramping, (+) voids, small bleed, denies HA/NV/dizziness. Breastfeeding initiated, good latch and + colostrum noted.  O:  VSS, AAO x 3, NAD  FF bellow U  Small lochia  A / P:   PPD #0  Stable post partum  Routine PP orders  [facility]   Durwin NoraUL,Janecia Palau, CNM, MSN  06/22/2016 1:08 PM

## 2016-06-22 NOTE — Anesthesia Preprocedure Evaluation (Signed)
Anesthesia Evaluation  Patient identified by MRN, date of birth, ID band Patient awake    Reviewed: Allergy & Precautions, H&P , Patient's Chart, lab work & pertinent test results  Airway Mallampati: III  TM Distance: >3 FB Neck ROM: full    Dental  (+) Teeth Intact   Pulmonary neg pulmonary ROS,    breath sounds clear to auscultation       Cardiovascular negative cardio ROS   Rhythm:regular Rate:Normal     Neuro/Psych    GI/Hepatic negative GI ROS, Neg liver ROS,   Endo/Other    Renal/GU negative Renal ROS     Musculoskeletal   Abdominal   Peds  Hematology   Anesthesia Other Findings       Reproductive/Obstetrics (+) Pregnancy                             Anesthesia Physical  Anesthesia Plan  ASA: II  Anesthesia Plan: Epidural   Post-op Pain Management:    Induction:   Airway Management Planned:   Additional Equipment:   Intra-op Plan:   Post-operative Plan:   Informed Consent: I have reviewed the patients History and Physical, chart, labs and discussed the procedure including the risks, benefits and alternatives for the proposed anesthesia with the patient or authorized representative who has indicated his/her understanding and acceptance.     Plan Discussed with:   Anesthesia Plan Comments:         Anesthesia Quick Evaluation

## 2016-06-22 NOTE — Anesthesia Procedure Notes (Signed)
Epidural Patient location during procedure: OB  Staffing Anesthesiologist: Chaysen Tillman Performed: anesthesiologist   Preanesthetic Checklist Completed: patient identified, pre-op evaluation, timeout performed, IV checked, risks and benefits discussed and monitors and equipment checked  Epidural Patient position: sitting Prep: site prepped and draped and DuraPrep Patient monitoring: heart rate Approach: midline Location: L2-L3 Injection technique: LOR air and LOR saline  Needle:  Needle type: Tuohy  Needle gauge: 17 G Needle length: 9 cm Needle insertion depth: 6 cm Catheter type: closed end flexible Catheter size: 19 Gauge Catheter at skin depth: 12 cm Test dose: negative  Assessment Sensory level: T8 Events: blood not aspirated, injection not painful, no injection resistance, negative IV test and no paresthesia  Additional Notes Reason for block:procedure for pain     

## 2016-06-22 NOTE — Lactation Note (Signed)
This note was copied from a baby's chart. Lactation Consultation Note  Patient Name: Meagan Sharon MtVanessa Dutan WUJWJ'XToday's Date: 06/22/2016 Reason for consult: Follow-up assessment Baby at 12 hr of life. Mom requested help with latch. Upon entry baby was being dressed. Mom really wanted to ask questions about pumping. She desires to use her personal Medela PIS. She had pumped prior to lactation arrival. She had 4ml and was worried that she was not making enough milk. She reports low supply with her daughter. Baby was sleeping but tried to latch him with no success. Coached mom through spoon feeding her expressed milk. Left lactation phone number on the white board to call at the next feeding. Mom was distracted during this visit because her daughter is present. She may need more education.    Maternal Data    Feeding Feeding Type: Breast Fed Length of feed: 0 min  LATCH Score/Interventions                      Lactation Tools Discussed/Used Pump Review: Setup, frequency, and cleaning;Milk Storage Initiated by:: ES Date initiated:: 06/22/16   Consult Status Consult Status: Follow-up Date: 06/23/16 Follow-up type: In-patient    [name] 06/22/2016, 6:01 PM

## 2016-06-22 NOTE — Progress Notes (Signed)
S:  comfortable now with epidural  O:  VS: Blood pressure 136/69, pulse 87, temperature 97.7 F (36.5 C), temperature source Oral, resp. rate 18, height 5' (1.524 m), weight 76.2 kg (168 lb), last menstrual period 10/05/2015, SpO2 95 %, unknown if currently breastfeeding.        FHR : baseline 140 / variability moderate / accelerations + / no decelerations        Toco: contractions every 2-5 minutes / moderate / pitocin 10 mu/min        Cervix : deferred        Foley just placed - yellow urine          A: active labor     FHR category 1  P: reposition on peanut - encouraged rest     titrate pitocin to achieve adequate UC pattern     recheck 1-4 hours     Marlinda MikeBAILEY, Gearld Kerstein CNM, MSN, [facility] 06/22/2016, 3:35 AM

## 2016-06-22 NOTE — Anesthesia Postprocedure Evaluation (Signed)
Anesthesia Post Note  Patient: Meagan Austin  Procedure(s) Performed: * No procedures listed *  Patient location during evaluation: Mother Baby Anesthesia Type: Epidural Level of consciousness: awake and alert Pain management: pain level controlled Vital Signs Assessment: post-procedure vital signs reviewed and stable Respiratory status: spontaneous breathing and nonlabored ventilation Cardiovascular status: stable Postop Assessment: no headache, patient able to bend at knees, no backache, no signs of nausea or vomiting, epidural receding and adequate PO intake Anesthetic complications: no     Last Vitals:  Vitals:   06/22/16 1000 06/22/16 1530  BP: 123/70 128/73  Pulse: 96 93  Resp: 20 18  Temp: 36.9 C 36.5 C    Last Pain:  Vitals:   06/22/16 1630  TempSrc:   PainSc: 2    Pain Goal: Patients Stated Pain Goal: 6 (06/22/16 0955)               Laban EmperorMalinova,Martha Ellerby Hristova

## 2016-06-22 NOTE — Lactation Note (Addendum)
This note was copied from a baby's chart. Lactation Consultation Note  Mother and baby resting.  Baby 6 hours old. P2, Had large blood loss with 1st baby and stopped bf after 3 months. Has been shown hand expression and mother stated although initially she had trouble latching this baby has been able to latch 3 times and is improving. Suggest she call for assistance w/ next feeding. Mom made aware of O/P services, breastfeeding support groups, community resources, and our phone # for post-discharge questions.      Patient Name: Meagan Austin Meagan Austin Date: 06/22/2016 Reason for consult: Initial assessment   Maternal Data Has patient been taught Hand Expression?: Yes Does the patient have breastfeeding experience prior to this delivery?: Yes  Feeding Feeding Type: Breast Fed Length of feed: 15 min  LATCH Score/Interventions Latch: Repeated attempts needed to sustain latch, nipple held in mouth throughout feeding, stimulation needed to elicit sucking reflex. Intervention(s): Adjust position;Assist with latch;Breast massage  Audible Swallowing: None Intervention(s): Skin to skin;Hand expression Intervention(s): Skin to skin  Type of Nipple: Flat Intervention(s): Reverse pressure  Comfort (Breast/Nipple): Soft / non-tender     Hold (Positioning): Assistance needed to correctly position infant at breast and maintain latch. Intervention(s): Breastfeeding basics reviewed;Support Pillows;Position options;Skin to skin  LATCH Score: 5  Lactation Tools Discussed/Used     Consult Status Consult Status: Follow-up Date: 06/23/16 Follow-up type: In-patient    Dahlia ByesBerkelhammer, Ruth [facility] 06/22/2016, 11:38 AM

## 2016-06-23 DIAGNOSIS — D62 Acute posthemorrhagic anemia: Secondary | ICD-10-CM | POA: Diagnosis not present

## 2016-06-23 LAB — CBC
HCT: 27.1 % — ABNORMAL LOW (ref 36.0–46.0)
Hemoglobin: 9.4 g/dL — ABNORMAL LOW (ref 12.0–15.0)
MCH: 31.9 pg (ref 26.0–34.0)
MCHC: 34.7 g/dL (ref 30.0–36.0)
MCV: 91.9 fL (ref 78.0–100.0)
Platelets: 164 10*3/uL (ref 150–400)
RBC: 2.95 MIL/uL — ABNORMAL LOW (ref 3.87–5.11)
RDW: 15.9 % — ABNORMAL HIGH (ref 11.5–15.5)
WBC: 7.5 10*3/uL (ref 4.0–10.5)

## 2016-06-23 MED ORDER — POLYSACCHARIDE IRON COMPLEX 150 MG PO CAPS
150.0000 mg | ORAL_CAPSULE | Freq: Every day | ORAL | Status: DC
  Administered 2016-06-23 – 2016-06-24 (×2): 150 mg via ORAL
  Filled 2016-06-23 (×2): qty 1

## 2016-06-23 MED ORDER — MAGNESIUM OXIDE 400 (241.3 MG) MG PO TABS
400.0000 mg | ORAL_TABLET | Freq: Every day | ORAL | Status: DC
  Administered 2016-06-23 – 2016-06-24 (×2): 400 mg via ORAL
  Filled 2016-06-23 (×3): qty 1

## 2016-06-23 NOTE — Lactation Note (Addendum)
This note was copied from a baby's chart. Lactation Consultation Note  Mother's nipples are flat. Baby latched on L side with #20NS given to her by CNM. Intermittent sucks and swallows observed. R nipple has been bleeding this morning.  Gave mother shells to wear inside of bra. Suggest post pumping 4-5 times a day and give volume back to baby at next feeding. Offered to set up DEBP but mother stated she has her own DEBP in room . Mother states she pumped earlier and received 10 ml. Provided mother w/ an additional #20NS and #24 NS for future use if needed. Discussed when soreness subsides to try half way through feeding to latch without NS. Nipples very inflamed. Set up OP appt. for 12/20 at 10:30 am to update feeding plan. Mother has coconut oil for soreness. Suggest she call if further assistance is needed.   Patient Name: Boy Sharon MtVanessa Weissberg ZOXWR'UToday's Date: 06/23/2016     Maternal Data    Feeding Feeding Type: Breast Fed Length of feed: 65 min  LATCH Score/Interventions Latch: Grasps breast easily, tongue down, lips flanged, rhythmical sucking.  Audible Swallowing: A few with stimulation  Type of Nipple: Flat Intervention(s):  (nipple shield)  Comfort (Breast/Nipple): Filling, red/small blisters or bruises, mild/mod discomfort  Problem noted: Mild/Moderate discomfort Interventions (Mild/moderate discomfort): Comfort gels  Hold (Positioning): No assistance needed to correctly position infant at breast.  LATCH Score: 7  Lactation Tools Discussed/Used Tools: Nipple Shields Nipple shield size: 20   Consult Status Consult Status: Follow-up Date: 06/24/16 Follow-up type: In-patient    Dahlia ByesBerkelhammer, Marlene Pfluger [facility] 06/23/2016, 11:36 AM

## 2016-06-23 NOTE — Lactation Note (Addendum)
This note was copied from a baby's chart. Lactation Consultation Note  See previous note. Mother has an abrasion on R nipple and was called in to inquire about it.  This is where she was bleeding earlier which is starting to form a scab. Provided mother with comfort gels. She has been wearing shells to help evert nipples and protect from rubbing. Suggest she alternate between gels and shells. Suggest she call for further assistance if needed. Mother has OP appt on 12/20.   Patient Name: Meagan Sharon MtVanessa Hallahan WUJWJ'XToday's Date: 06/23/2016     Maternal Data    Feeding Feeding Type: Breast Fed  LATCH Score/Interventions                      Lactation Tools Discussed/Used     Consult Status      Dahlia ByesBerkelhammer, Ruth Boschen 06/23/2016, 3:00 PM

## 2016-06-23 NOTE — Lactation Note (Signed)
This note was copied from a baby's chart. Lactation Consultation Note  Patient Name: Meagan Austin ZOXWR'UToday's Date: 06/23/2016 Reason for consult: Follow-up assessment;Breast/nipple pain  Asked to assist and assess with latching, baby 5634 hrs old.  Mom has been using a nipple shield (20 mm) when latching.  Mom had just finished with a 10 minute feeding.  Mom unsure if baby was swallowing, and didn't notice any colostrum in shield.  Offered to assist with second side, but Mom initially resistant to this as baby was asleep.  After talking to Mom, she agreed to try to latch baby on right breast.  Right nipple reddened and nipple tip scabbed.  Watched Mom apply shield, assisted Mom to apply it to help evert nipple deeper into shield.  Baby not opening his mouth widely.  Tried to gently pull down on baby's chin, to open baby's mouth more. Baby needing stimulation to continue sucking, no swallowing heard or identified.  Alternate breast compression demonstrated and explained.  Explained to Mom concern that baby isn't latching deeply onto areola, and not transferring adequate milk.  Recommended that she supplement with 5 fr feeding tube and syringe with EBM+/formula at the breast under nipple shield.  When LC returned to set up supplementation, Mom stated that baby had latched better because she had moved her finger from baby's chin.  Alimentum and feeding tube and syringe in room.  Set up DEBP and assisted Mom to pump for first time.  Recommended pumping each time after baby BFs.  To BF at least every 3 hrs, sooner if baby cues.       Consult Status Consult Status: Follow-up Date: 06/24/16 Follow-up type: In-patient    [name], [name] [name] 06/23/2016, 4:35 PM

## 2016-06-23 NOTE — Progress Notes (Signed)
PPD # 1 SVD Information for the patient's newborn:  Meagan Austin, Boy Meagan Austin [[id number]][[id number]]  female    breast feeding, using shield, EBM via syringe, difficult latch  / [facility] Baby name: Meagan CurdGraham  S:  Reports feeling tired but well             Tolerating po/ No nausea or vomiting             Bleeding is light             Pain controlled with ibuprofen (OTC)             Up ad lib / ambulatory / voiding without difficulties        O:  A & O x 3, in no apparent distress              VS:  Vitals:   06/22/16 1000 06/22/16 1530 06/22/16 2230 06/23/16 0626  BP: 123/70 128/73 127/61 (!) 112/57  Pulse: 96 93 90 82  Resp: 20 18 18 18   Temp: 98.5 F (36.9 C) 97.7 F (36.5 C) 98 F (36.7 C) 97.4 F (36.3 C)  TempSrc: Oral Oral Oral Oral  SpO2:      Weight:      Height:        LABS:  Recent Labs  06/21/16 1323 06/23/16 0530  WBC 8.9 7.5  HGB 12.5 9.4*  HCT 37.2 27.1*  PLT 185 164    Blood type: --/--/O POS (12/13 1323)  Rubella: Immune (04/28 0000)   I&O: I/O last 3 completed shifts: In: -  Out: 1000 [Urine:500; Blood:500]          No intake/output data recorded.  Lungs: Clear and unlabored  Heart: regular rate and rhythm / no murmurs  Abdomen: soft, non-tender, non-distended             Fundus: firm, non-tender, U-1  Perineum: repair intact, no edema  Lochia: small  Extremities: trace edema, no calf pain or tenderness    A/P: PPD # 1 25 y.o., [id number]   Principal Problem:   Postpartum care following vaginal delivery (12/14) Active Problems:    VBAC (vaginal birth after Cesarean)    ABL anemia  Doing well - stable status  Start oral Fe and MagOx, alfalfa tabs at DC  Routine post partum orders  Lactation support for latch  Anticipate discharge tomorrow    Meagan Mendsaniela C Paul, MSN, CNM 06/23/2016, 10:40 AM

## 2016-06-24 MED ORDER — BENZOCAINE-MENTHOL 20-0.5 % EX AERO: 1.0000 "application " | INHALATION_SPRAY | CUTANEOUS | Status: AC | PRN

## 2016-06-24 MED ORDER — COCONUT OIL OIL: 1.0000 "application " | TOPICAL_OIL | 0 refills | Status: AC | PRN

## 2016-06-24 MED ORDER — ALFALFA 500 MG PO TABS: 4.0000 | ORAL_TABLET | Freq: Every day | ORAL | 0 refills | Status: AC

## 2016-06-24 NOTE — Progress Notes (Signed)
Post Partum Day #2           Information for the patient's newborn:  Lonell FaceHayes, Boy Pebbles [[id number]][[id number]]  female  circumcision planned outpatient Baby name: Cheree DittoGraham Feeding: breast, improving  Subjective: No HA, SOB, CP, F/C, breast symptoms. Pain minimal. Normal vaginal bleeding, no clots.    Desires DC home today.  Objective:  VS:  Vitals:   06/22/16 2230 06/23/16 0626 06/23/16 1812 06/24/16 0527  BP: 127/61 (!) 112/57 (!) 116/59 115/68  Pulse: 90 82 99 89  Resp: 18 18 18 18   Temp: 98 F (36.7 C) 97.4 F (36.3 C) 97.8 F (36.6 C) 97.6 F (36.4 C)  TempSrc: Oral Oral Oral   SpO2:      Weight:      Height:        No intake or output data in the 24 hours ending 06/24/16 0706     Recent Labs  06/21/16 1323 06/23/16 0530  WBC 8.9 7.5  HGB 12.5 9.4*  HCT 37.2 27.1*  PLT 185 164    Blood type: --/--/O POS (12/13 1323) Rubella: Immune (04/28 0000)    Physical Exam:  General: alert, cooperative and no distress Uterine Fundus: firm Lochia: appropriate Perineum: repair intact, edema mild DVT Evaluation: No cords or calf tenderness. No significant calf/ankle edema.    Assessment/Plan: PPD # 2 / 25 y.o., [id number] S/P:spontaneous vaginal   Principal Problem:   Postpartum care following vaginal delivery (12/14) Active Problems:   VBAC (vaginal birth after Cesarean)   Acute blood loss anemia    normal postpartum exam  Continue current postpartum care  D/C home   LOS: 3 days   Neta Mendsaniela C Paul, CNM, MSN 06/24/2016, 7:06 AM

## 2016-06-24 NOTE — Discharge Summary (Signed)
Obstetric Discharge Summary Reason for Admission: onset of labor and rupture of membranes Prenatal Procedures: ultrasound Intrapartum Procedures: spontaneous vaginal delivery Postpartum Procedures: none Complications-Operative and Postpartum: 2nd degree perineal laceration Hemoglobin  Date Value Ref Range Status  06/23/2016 9.4 (L) 12.0 - 15.0 g/dL Final    Comment:    DELTA CHECK NOTED REPEATED TO VERIFY    HCT  Date Value Ref Range Status  06/23/2016 27.1 (L) 36.0 - 46.0 % Final    Physical Exam:  General: alert, cooperative and no distress Lochia: appropriate Uterine Fundus: firm Incision: healing well DVT Evaluation: No cords or calf tenderness. No significant calf/ankle edema.  Discharge Diagnoses: Term Pregnancy-delivered  Discharge Information: Date: 06/24/2016 Activity: pelvic rest Diet: routine Medications: PNV, Ibuprofen and Iron Condition: stable Instructions: refer to practice specific booklet Discharge to: home Follow-up Information    Neta Mendsaniela C Austin, CNM Follow up.   Specialty:  Obstetrics and Gynecology Contact information: [address] [place] [id number] [phone]           Newborn Data: Live born female Meagan Austin Birth Weight: 6 lb 11.6 oz (3050 g) APGAR: 6, 7  Home with mother.  Neta MendsDaniela C Austin 06/24/2016, 7:20 AM

## 2016-06-24 NOTE — Lactation Note (Signed)
This note was copied from a baby's chart. Lactation Consultation Note  Patient Name: Meagan Austin WUJWJ'XToday's Date: 06/24/2016 Reason for consult: Follow-up assessment;Hyperbilirubinemia;Other (Comment) (early term) Baby just coming off right breast using 16 nipple shield, colostrum present in nipple shield with latch. Mom reports baby is latching better and she starting last night she is observing colostrum in the nipple shield with feedings. Per chart review baby has been to breast 9 times in past 24 hours for 30-60 minute on average, 5 voids/3 stools in 24 hours. Weight loss 8.8% with at 5.2% weight loss in past 24 hours. Bili levels at 49 hours of age 25.9 - high/intermediate zone. Weight loss per Newt Newborn graphing 90-95%. With all these factors, suggested to Mom baby would need some supplementation. Mom requested baby be re-weighed. LC re-weighed baby which now indicates 10% weight loss. Mom agreed to supplement at breast using 5 fr feeding tube/syringe. LC assisted Mom at this visit reviewing how to apply nipple shield correctly and positioning for baby to sustain depth with latch. PLan discussed with Mom is to BF with feeding ques, at least 8-12 times in 24 hours. BF on 1st breast without the supplement for 15-20 minutes then switch to 2nd breast and give supplement while baby nurses on 2nd breast. Advised 20 ml each feeding today but to start with 10 ml then increase to 15 then increase to 20. Baby can have more if needed to satisfy baby. Encouraged Mom to post pump for 15 minutes to encourage milk production and to have EBM to supplement when available. Mom agreeable to plan. Encouraged to call for assist as needed.   Maternal Data    Feeding Feeding Type: Formula Length of feed: 28 min  LATCH Score/Interventions Latch: Grasps breast easily, tongue down, lips flanged, rhythmical sucking. (using 16 nipple shield) Intervention(s): Adjust position;Assist with latch;Breast massage;Breast  compression  Audible Swallowing: A few with stimulation  Type of Nipple: Flat Intervention(s): Shells  Comfort (Breast/Nipple): Filling, red/small blisters or bruises, mild/mod discomfort  Problem noted: Mild/Moderate discomfort Interventions  (Cracked/bleeding/bruising/blister): Expressed breast milk to nipple (coconut oil)  Hold (Positioning): Assistance needed to correctly position infant at breast and maintain latch. Intervention(s): Breastfeeding basics reviewed;Support Pillows;Position options;Skin to skin  LATCH Score: 6  Lactation Tools Discussed/Used Tools: 30F feeding tube / Syringe;Nipple Dorris CarnesShields;Shells;Pump Nipple shield size: 16 Shell Type: Inverted Breast pump type: Double-Electric Breast Pump   Consult Status Consult Status: Follow-up Date: 06/25/16 Follow-up type: In-patient    Meagan Austin, Meagan Austin 06/24/2016, 10:30 AM

## 2016-06-25 ENCOUNTER — Ambulatory Visit: Payer: Self-pay

## 2016-06-25 NOTE — Lactation Note (Addendum)
This note was copied from a baby's chart. Lactation Consultation Note  Patient Name: Boy Sharon MtVanessa Longmore AVWUJ'WToday's Date: 06/25/2016  Mom reports baby is latching better today, she reports observing a lot of colostrum in nipple shield with feedings. Mom has increased supplements today reporting her goal is to supplement with 20-30 ml with feedings but Mom reports with last feeding baby would not take 30 ml. Mom reports she feels baby getting good amount of milk from breast. Baby has had 3 voids in past 24 hours, last stool other than a smear prior to my visit was yesterday at 1450. LC advised Mom the extra calories from supplementing will help baby have more stool and also give baby more energy to work with BF. Mom to continue to pump after feedings. Mom reports her breast are starting to fill but not engorged. LC encouraged Mom to call with next feeding for LC to observe latch and re-check nipple shield for good fit. Mom requested to change OP f/u with lactation. Appointment on Wednesday, 06/28/16 cancelled. New appointment scheduled for Thursday, 06/29/16 at 0900.    Maternal Data    Feeding Feeding Type: Breast Fed Length of feed: 33 min  LATCH Score/Interventions                      Lactation Tools Discussed/Used     Consult Status      Alfred LevinsGranger, Ulyess Muto Ann 06/25/2016, 3:28 PM

## 2016-06-25 NOTE — Lactation Note (Signed)
This note was copied from a baby's chart. Lactation Consultation Note  Patient Name: Meagan Austin ZOXWR'UToday's Date: 06/25/2016 Reason for consult: Follow-up assessment Lactation called to room to observe a feeding. Upon entry baby was just coming off the breast. Mom will call at next feeding.   Maternal Data    Feeding Feeding Type: Formula Length of feed: 16 min  LATCH Score/Interventions Latch: Grasps breast easily, tongue down, lips flanged, rhythmical sucking.  Audible Swallowing: A few with stimulation  Type of Nipple: Everted at rest and after stimulation  Comfort (Breast/Nipple): Filling, red/small blisters or bruises, mild/mod discomfort  Problem noted: Mild/Moderate discomfort Interventions  (Cracked/bleeding/bruising/blister): Double electric pump;Other (comment) (coconut oil)  Hold (Positioning): No assistance needed to correctly position infant at breast.  LATCH Score: 8  Lactation Tools Discussed/Used Tools: Nipple Shields Nipple shield size: 16 Shell Type: Inverted Breast pump type: Double-Electric Breast Pump   Consult Status Consult Status: Follow-up Date: 06/26/16 Follow-up type: In-patient    [name] 06/25/2016, 7:44 PM

## 2016-06-26 ENCOUNTER — Ambulatory Visit: Payer: Self-pay

## 2016-06-26 NOTE — Lactation Note (Signed)
This note was copied from a baby's chart. Lactation Consultation Note  Patient Name: Meagan Austin MtVanessa Eimers NWGNF'AToday's Date: 06/26/2016 Reason for consult: Follow-up assessment   With this mom and early term baby, now 324 days old and 37 6/7 weeks CGa Mom was getting ready to latch baby. I assisted her with positioning the shield, so baby's nose would be in contact with her skin. I also showed mom how to advance baby beyond the nipple, so that the baby was causing breast movement. Lots of transitional milk seen in shield, baby vigorous at the breast.  I decreased mom to 21 flanges to try, and see if they are a better fit. Some of her areola tenderness could be from 24 flanges being too large. Mm overall doing a great job. I gave her extra nipple shields, and oral tubing and syringes to take home. Mom is supplementing baby at the breast with these. Mom knows too call for questions/concerns   Maternal Data    Feeding Feeding Type: Breast Fed  LATCH Score/Interventions Latch: Repeated attempts needed to sustain latch, nipple held in mouth throughout feeding, stimulation needed to elicit sucking reflex. Intervention(s): Skin to skin;Teach feeding cues;Waking techniques Intervention(s): Adjust position;Assist with latch;Breast massage;Breast compression  Audible Swallowing: Spontaneous and intermittent  Type of Nipple: Flat  Comfort (Breast/Nipple): Filling, red/small blisters or bruises, mild/mod discomfort  Problem noted: Severe discomfort;Cracked, bleeding, blisters, bruises (left nipple very red and tender, ) Interventions (Mild/moderate discomfort): Comfort gels;Post-pump  Hold (Positioning): Assistance needed to correctly position infant at breast and maintain latch. Intervention(s): Breastfeeding basics reviewed;Support Pillows;Position options;Skin to skin  LATCH Score: 6  Lactation Tools Discussed/Used Tools: Nipple Shields Nipple shield size: 16   Consult Status Consult Status:  Follow-up Follow-up type: Out-patient    Alfred LevinsLee, Janasha Barkalow Anne 06/26/2016, 11:03 AM

## 2016-06-28 ENCOUNTER — Ambulatory Visit (HOSPITAL_COMMUNITY): Payer: No Typology Code available for payment source

## 2016-06-29 ENCOUNTER — Ambulatory Visit (HOSPITAL_COMMUNITY)
Admission: RE | Admit: 2016-06-29 | Discharge: 2016-06-29 | Disposition: A | Discharge status: Home / Self Care | Payer: No Typology Code available for payment source | Source: Ambulatory Visit | Attending: Certified Nurse Midwife | Admitting: Certified Nurse Midwife

## 2016-06-29 DIAGNOSIS — Z029 Encounter for administrative examinations, unspecified: Secondary | ICD-10-CM | POA: Insufficient documentation

## 2016-06-29 NOTE — Lactation Note (Signed)
Lactation Consult  Mother's reason for visit: visti was scheduled on day of discharge from hospital.infant was  37.2 weeks. Infant is 537 days old today. Visit Type:feeding assessment   Appointment Notes:  Mother states that infant has been feeding well. He is exclusively breastfeeding. She is not supplementing. Mother is here for weight check and feeding assessment. Consult:  Initial Lactation Consultant:  Michel BickersKendrick, Rakel Junio McCoy  ________________________________________________________________________    ________________________________________________________________________  Mother's Name: Meagan Austin Type of delivery:  vaginal  Breastfeeding Experience:  Pumping and breastfeeding for 3 months Maternal Medical Conditions:  Post-partum hemorrhage Maternal Medications:  Wellbutrin zoloft iron prenatal vit  ________________________________________________________________________  Breastfeeding History (Post Discharge)  Frequency of breastfeeding: 2-3 hours Duration of feeding: 20 mins 10 on each breast  Patient does not supplement or pump.  Infant Intake and Output Assessment  Voids:  8-10 in 24 hrs.  Color:  Clear yellow Stools: 8 in 24 hrs.  Color:  Yellow  ________________________________________________________________________  Maternal Breast Assessment  Breast:  Full Nipple:  Flat Pain level:  0 Pain interventions:  Bra  _______________________________________________________________________ Feeding Assessment/Evaluation     Mother applied the #16 nipple shield very shallow . Mother was taught to apply nipple shield deeper. Infant transferred 20 ml form first breast.    Infant's oral assessment:  Variance  Positioning:  Cross cradle Right breast  LATCH documentation:  Latch:  2 = Grasps breast easily, tongue down, lips flanged, rhythmical sucking.  Audible swallowing:  2 = Spontaneous and intermittent  Type of nipple:  2 = Everted at rest and after  stimulation  Comfort (Breast/Nipple):  1 = Filling, red/small blisters or bruises, mild/mod discomfort  Hold (Positioning):  1 = Assistance needed to correctly position infant at breast and maintain latch  LATCH score:  8  Attached assessment:  Shallow  Lips flanged:  No.  Lips untucked:  No.  Suck assessment:  Displays both  Tools:  Nipple shield 16 mm Instructed on use and cleaning of tool:  Yes.    Pre-feed weight: 5-13 , 2658 g Post-feed weight: 5-14.5 2678  Amount transferred:  20 ml   Additional Feeding Assessment - infant took only 4 ml from that alternate breast.   Infant's oral assessment:  Variance, anterior tongue tie and likely a posterior tie, discussed benefits of a revision.   Positioning:  Cross cradle Left breast  LATCH documentation:  Latch:  2 = Grasps breast easily, tongue down, lips flanged, rhythmical sucking.  Audible swallowing:  2 = Spontaneous and intermittent  Type of nipple:  2 = Everted at rest and after stimulation  Comfort (Breast/Nipple):  1 = Filling, red/small blisters or bruises, mild/mod discomfort  Hold (Positioning):  1 = Assistance needed to correctly position infant at breast and maintain latch  LATCH score:  8  Attached assessment:  Shallow  Lips flanged:  No.  Lips untucked:  No.  Suck assessment:  Displays both  Tools:  Nipple shield 16 mm Instructed on use and cleaning of tool:  Yes.    Pre-feed weight:   5-14.5, 2678 Post-feed weight: 5-14.6, 2682 Amount transferred:  4 ml   Total amount transferred:24 ml  Advised mother to continue to breastfeed with all feeding cues and at least 8-12 times in 24 hours Mother to continue to post pump every 2-3 hours after each feeding.   Infant lost 2 ounces but [facility] Mother advised to supplement infant with 15-30 ml after every other  feeding .  Mother is concerned if supplement with bottle it may confuse infant. Lots of discussion about late  preterm infants.  Advise [facility] to [facility]  Mother to follow up Lactation next week . Mother to call and schedule appt.

## 2018-02-22 IMAGING — US US OB COMP LESS 14 WK
1 series · 15 of 28 positions shown · non-contrast
Comparison: Pelvic ultrasound 05/12/2011.

CLINICAL DATA: Pregnant patient with vaginal bleeding.

EXAM:
OBSTETRIC <14 WK US AND TRANSVAGINAL OB US
TECHNIQUE: Both transabdominal and transvaginal ultrasound examinations were
performed for complete evaluation of the gestation as well as the
maternal uterus, adnexal regions, and pelvic cul-de-sac.
Transvaginal technique was performed to assess early pregnancy.

[Series 1: us ob comp less 14 wk · 15 of 64 slices shown]
[im 1/64]
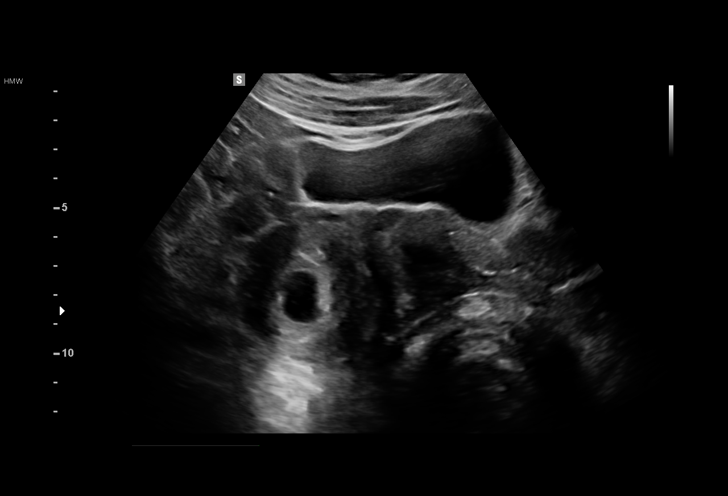
[im 5/64]
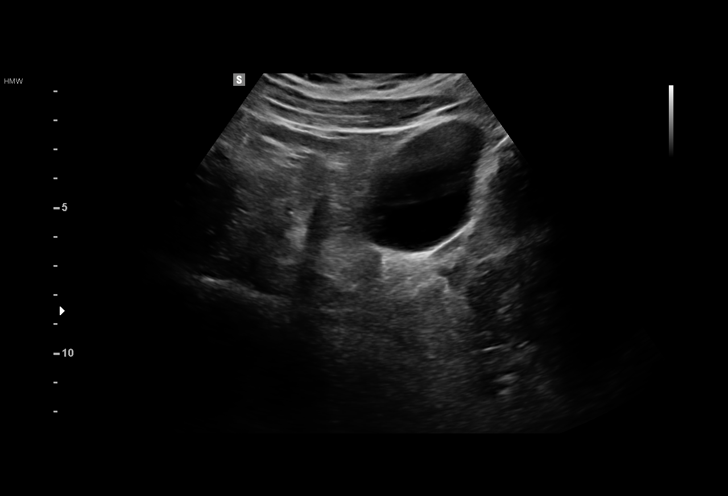
[im 10/64]
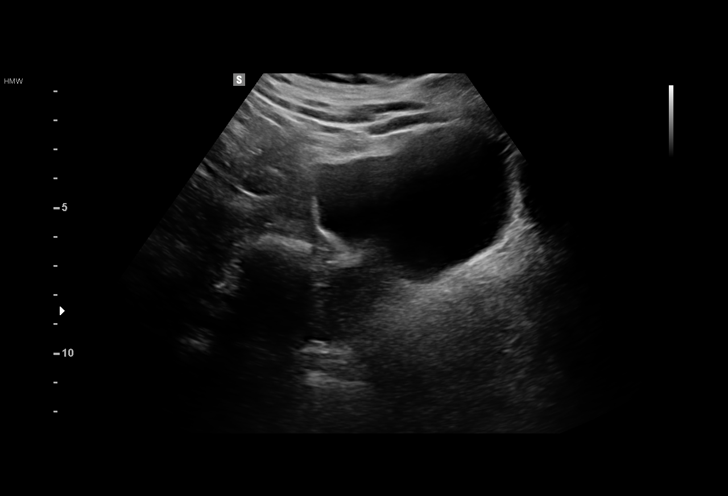
[im 15/64]
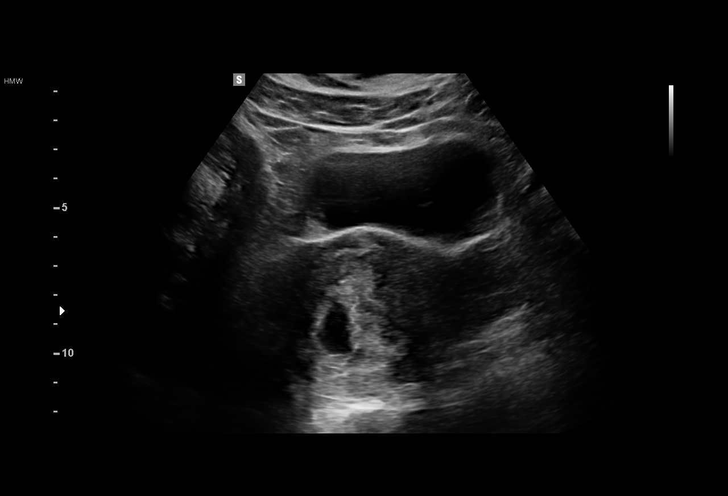
[im 19/64]
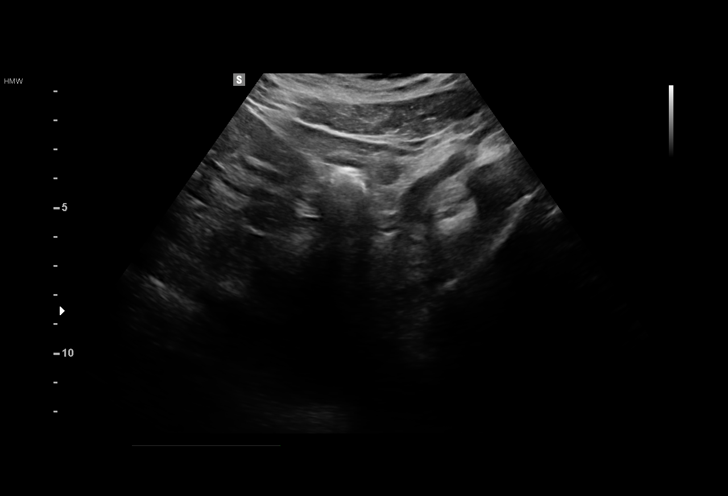
[im 24/64]
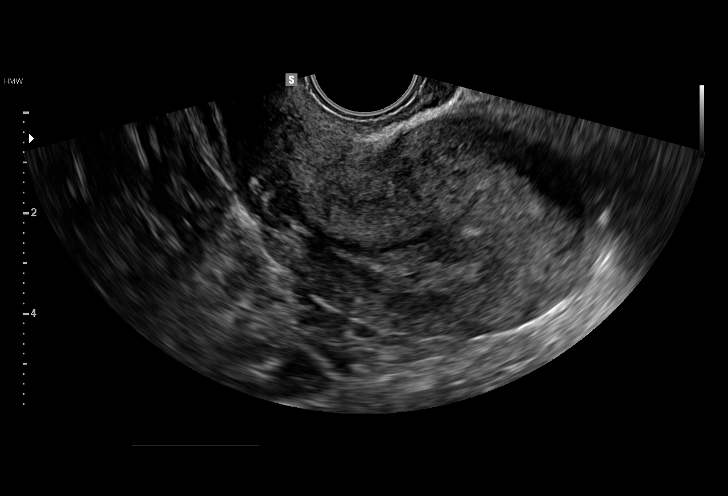
[im 29/64]
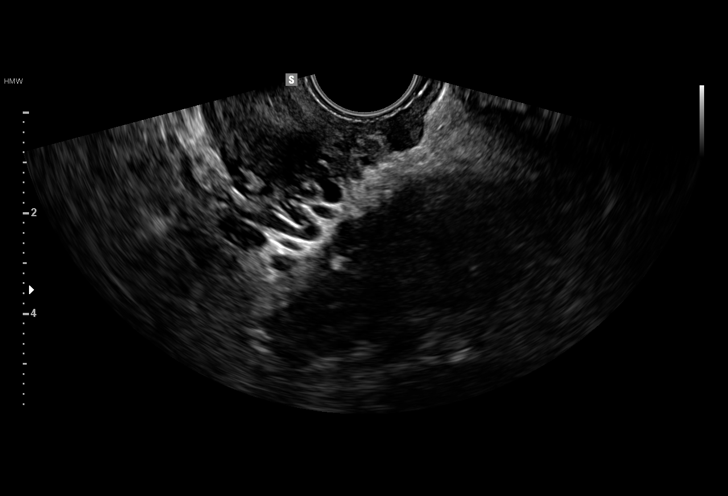
[im 33/64]
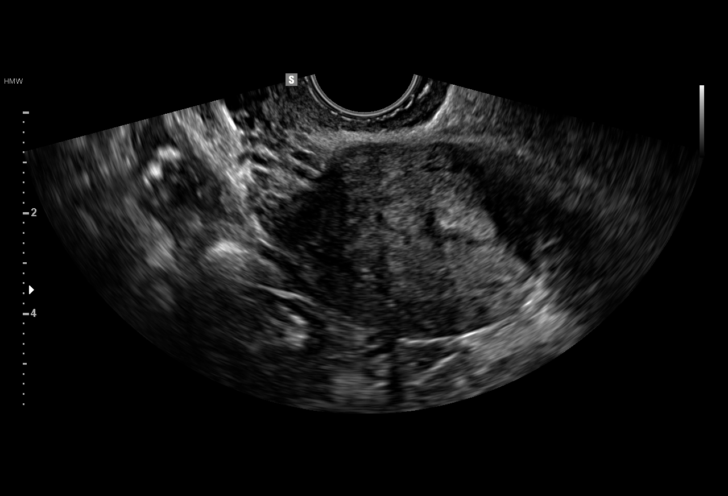
[im 36/64]
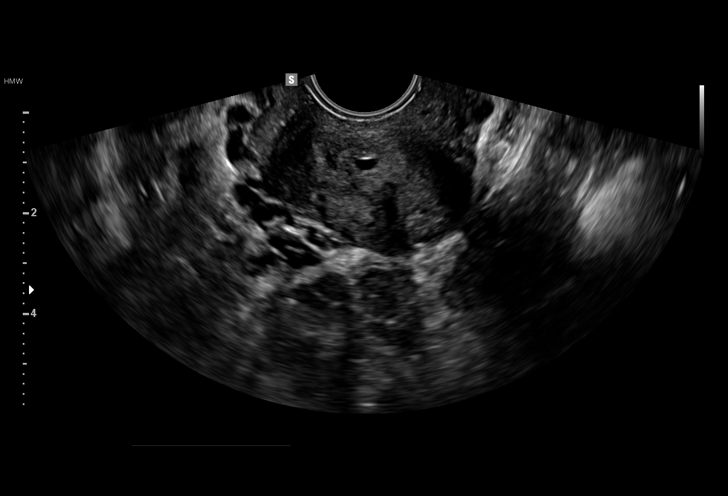
[im 40/64]
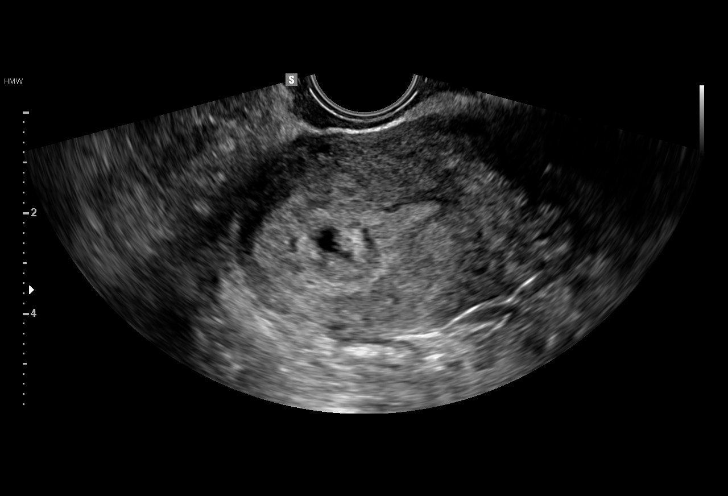
[im 45/64]
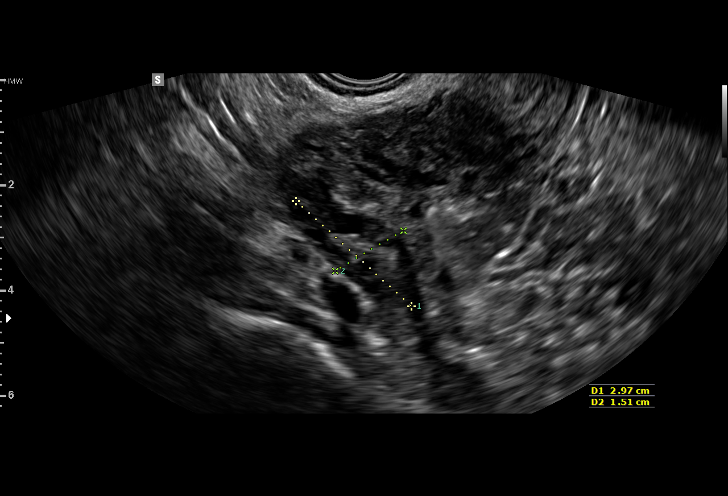
[im 50/64]
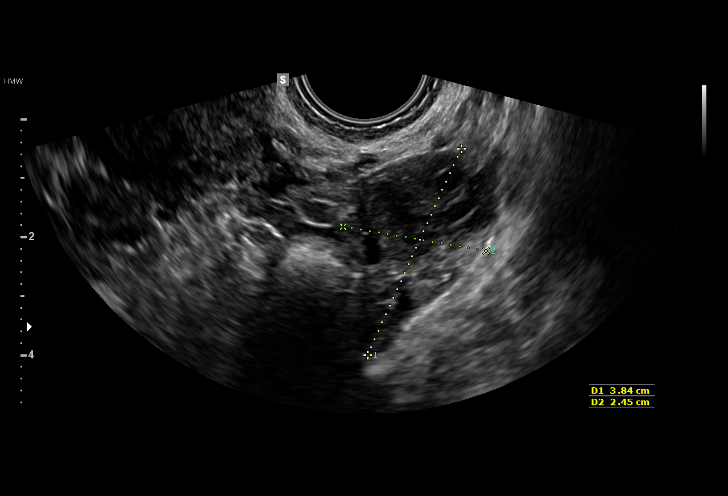
[im 54/64]
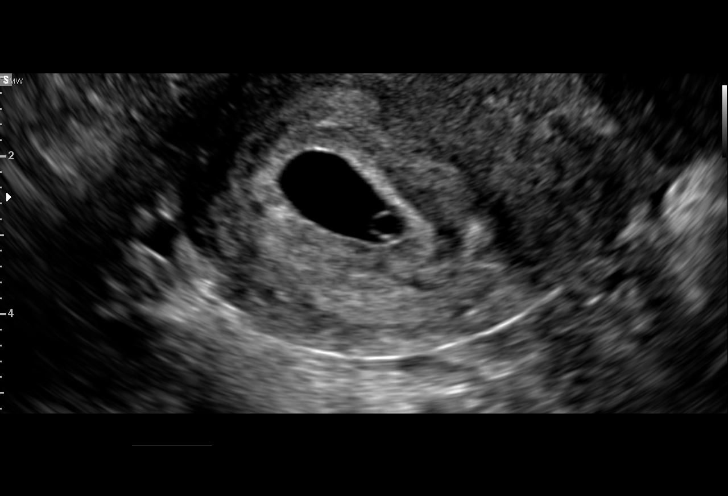
[im 59/64]
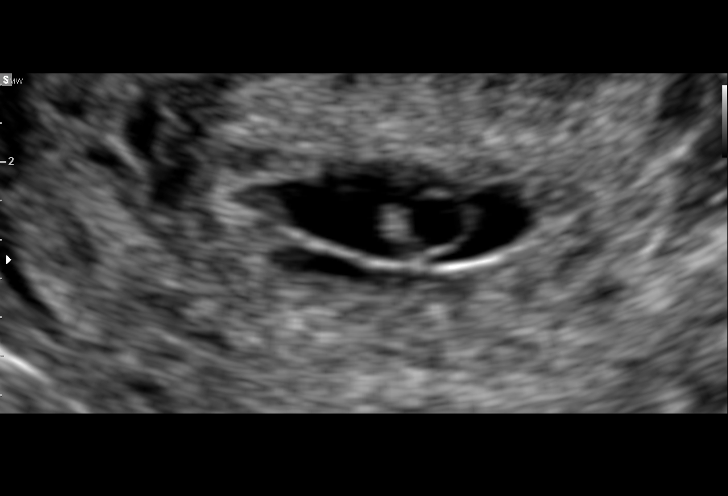
[im 64/64]
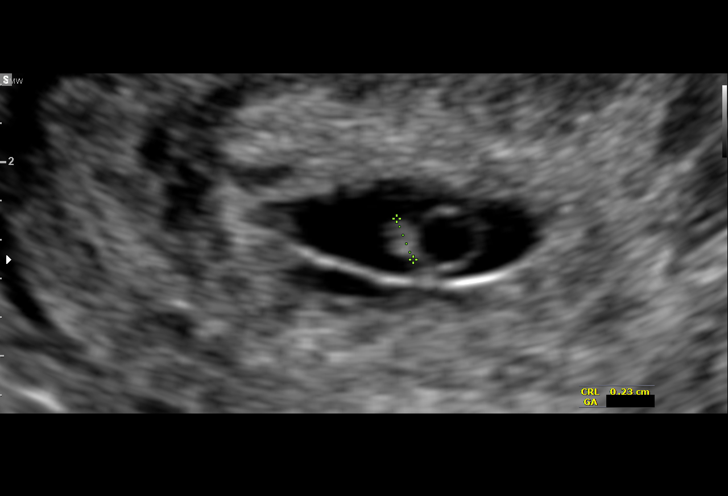

[15 of 28 positions shown; findings below may reference images not displayed]

FINDINGS: Intrauterine gestational sac: Single

Yolk sac:  Present

Embryo:  Present

Cardiac Activity: Present

Heart Rate: 102  bpm

CRL:  22  Mm   5 w   5 d                  US EDC: 07/15/2016

Subchorionic hemorrhage:  Small

Maternal uterus/adnexae: Normal right and left ovaries. No free
fluid in the pelvis.
IMPRESSION: Single live intrauterine gestation.  Small subchorionic hemorrhage.

## 2019-04-15 ENCOUNTER — Ambulatory Visit: Payer: BC Managed Care – PPO | Admitting: Adult Health

## 2019-04-15 ENCOUNTER — Encounter: Payer: Self-pay | Admitting: Adult Health

## 2019-04-15 ENCOUNTER — Other Ambulatory Visit: Payer: Self-pay

## 2019-04-15 ENCOUNTER — Ambulatory Visit (INDEPENDENT_AMBULATORY_CARE_PROVIDER_SITE_OTHER): Payer: BC Managed Care – PPO | Admitting: Adult Health

## 2019-04-15 DIAGNOSIS — F411 Generalized anxiety disorder: Secondary | ICD-10-CM | POA: Diagnosis not present

## 2019-04-15 DIAGNOSIS — F909 Attention-deficit hyperactivity disorder, unspecified type: Secondary | ICD-10-CM

## 2019-04-15 DIAGNOSIS — G4700 Insomnia, unspecified: Secondary | ICD-10-CM | POA: Diagnosis not present

## 2019-04-15 DIAGNOSIS — F331 Major depressive disorder, recurrent, moderate: Secondary | ICD-10-CM

## 2019-04-15 MED ORDER — SERTRALINE HCL 100 MG PO TABS: 100.0000 mg | ORAL_TABLET | Freq: Every day | ORAL | 5 refills | Status: DC

## 2019-04-15 MED ORDER — AMPHETAMINE-DEXTROAMPHET ER 30 MG PO CP24: 30.0000 mg | ORAL_CAPSULE | Freq: Every day | ORAL | 0 refills | Status: DC

## 2019-04-15 MED ORDER — AMPHETAMINE-DEXTROAMPHETAMINE 20 MG PO TABS: 20.0000 mg | ORAL_TABLET | Freq: Every day | ORAL | 0 refills | Status: DC

## 2019-04-15 MED ORDER — BUPROPION HCL ER (XL) 150 MG PO TB24: 150.0000 mg | ORAL_TABLET | Freq: Every day | ORAL | 5 refills | Status: DC

## 2019-04-15 NOTE — Progress Notes (Signed)
[facility] MD/PA/NP Initial Note  04/15/2019 6:09 PM Meagan Austin  MRN:  [id number]  Chief Complaint:  Chief Complaint    Anxiety; Depression      HPI:   Referred by therapist - Jerelyn Charles in [place].  Describes mood today as "ok". Pleasant. Mood symptoms - reports depression, anxiety, and irritability "some days are better than others". Has days of "excessive crying". Having panic attacks. Started on medication 2 years ago. Doesn't feel as "bad as she did - but not as good as she needs it too". Started having panic attacks after quarantine and when school restarted. She and husband arguing more with her working all the times. Started on Adderall 6 months ago. Prior to starting medications was messy - not keeping up with house. Was "scattered". Started on Adderall. Coworkers noticed a difference. Stating "I did not realize how much I was effected by ADHD until I was started on medications. Stating "I thought I was just depressed". Felt like she had things together at the start of the school. Started on Wellbutrin XL 300mg  - taking while breastfeeding. Had to stop while breast feeding. Then started on Zoloft 50mg  and escalated  It to 100mg . Felt her best on Zolodt 100g and Adderall XR 30mg . Decreased interest and motivation. Taking medications as prescribed.  Energy levels better over past few days since stopping Wellbutrin 2 days ago - "had been going down". Active, does not have a regular exercise routine. 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  Enjoys some usual interests and activities. Spending time with family. Married x 10 years. Has a 74 y/o daughter and a 2 y/o daughter. Parents local. Appetite adequate. Weight gain - "clothes a little lighter".  Sleeps better some nights than others. Averages 3 hours - new this year. Staying up late working. Some nights can't sleep  Focus and concentration "could be better". Finds herself getting distracted  mid-day. Getting things done. Completing tasks. Managing aspects of household.  Denies SI or HI. Denies AH or VH.  Visit Diagnosis:    ICD-10-CM   1. Insomnia, unspecified type  G47.00   2. Attention deficit hyperactivity disorder (ADHD), unspecified ADHD type  F90.9   3. Major depressive disorder, recurrent episode, moderate (HCC)  F33.1   4. Generalized anxiety disorder  F41.1     Past Psychiatric History: Denies psychiatric admissions  Past Medical History:  Past Medical History:  Diagnosis Date  . Bell's palsy    with first pregnancy  . Irritable bowel syndrome (IBS) 06-29-11   Bilat ear infections, finished  Amoxicillin last weekend (06-24-11)  . Neuromuscular disorder (HCC)    R side Bell's Palsy  . No pertinent past medical history   . Preterm labor     Past Surgical History:  Procedure Laterality Date  . CESAREAN SECTION  07/01/2011   Procedure: CESAREAN SECTION;  Surgeon: 9, MD;  Location: [facility];  Service: Gynecology;  Laterality: N/A;  . CHOLECYSTECTOMY    . CHOLECYSTECTOMY     2011  . NO PAST SURGERIES      Family Psychiatric History: Denies  Family History: History reviewed. No pertinent family history.  Social History:  Social History   Socioeconomic History  . Marital status: Married    Spouse name: Not on file  . Number of children: Not on file  . Years of education: Not on file  . Highest education level: Not on file  Occupational History  . Not  on file  Social Needs  . Financial resource strain: Not on file  . Food insecurity    Worry: Not on file    Inability: Not on file  . Transportation needs    Medical: Not on file    Non-medical: Not on file  Tobacco Use  . Smoking status: Never Smoker  . Smokeless tobacco: Never Used  Substance and Sexual Activity  . Alcohol use: No  . Drug use: No  . Sexual activity: Yes    Birth control/protection: None  Lifestyle  . Physical activity    Days per week: Not on file     Minutes per session: Not on file  . Stress: Not on file  Relationships  . Social Musicianconnections    Talks on phone: Not on file    Gets together: Not on file    Attends religious service: Not on file    Active member of club or organization: Not on file    Attends meetings of clubs or organizations: Not on file    Relationship status: Not on file  Other Topics Concern  . Not on file  Social History Narrative  . Not on file    Allergies:  Allergies  Allergen Reactions  . Food Rash and Other (See Comments)    Allergy:  Pears   . Latex Rash    Metabolic Disorder Labs: No results found for: HGBA1C, MPG No results found for: PROLACTIN No results found for: CHOL, TRIG, HDL, CHOLHDL, VLDL, LDLCALC No results found for: TSH  Therapeutic Level Labs: No results found for: LITHIUM No results found for: VALPROATE No components found for:  CBMZ  Current Medications: Current Outpatient Medications  Medication Sig Dispense Refill  . Alfalfa 500 MG TABS Take 4 tablets (2,000 mg total) by mouth daily. Start with one tablet and increase by one every 1-2 days  0  . benzocaine-Menthol (DERMOPLAST) 20-0.5 % AERO Apply 1 application topically as needed for irritation (perineal discomfort).    Marland Kitchen. buPROPion (WELLBUTRIN XL) 300 MG 24 hr tablet Take 300 mg by mouth daily.    . coconut oil OIL Apply 1 application topically as needed.  0  . magnesium oxide (MAG-OX) 400 (241.3 Mg) MG tablet Take 400 mg by mouth daily.    . Prenatal MV-Min-Fe Fum-FA-DHA [facility](SIMILAC PRENATAL EARLY SHIELD) 27-0.8 & 200 MG MISC Take 2 tablets by mouth daily.    . sertraline (ZOLOFT) 100 MG tablet Take 100 mg by mouth daily.    . sertraline (ZOLOFT) 50 MG tablet Take 50 mg by mouth daily.     No current facility-administered medications for this visit.     Medication Side Effects: none  Orders placed this visit:  No orders of the defined types were placed in this encounter.   Psychiatric Specialty Exam:  ROS  unknown  if currently breastfeeding.There is no height or weight on file to calculate BMI.  General Appearance: Neat and Well Groomed  Eye Contact:  Good  Speech:  Clear and Coherent  Volume:  Normal  Mood:  Anxious, Depressed and Irritable  Affect:  Congruent  Thought Process:  Coherent  Orientation:  Full (Time, Place, and Person)  Thought Content: Logical   Suicidal Thoughts:  No  Homicidal Thoughts:  No  Memory:  WNL  Judgement:  Good  Insight:  Good  Psychomotor Activity:  Normal  Concentration:  Concentration: Good  Recall:  Good  Fund of Knowledge: Good  Language: Good  Assets:  Communication Skills Desire  for Improvement Financial Resources/Insurance Housing Intimacy Leisure Time Physical Health Resilience Social Support Talents/Skills Transportation Vocational/Educational  ADL's:  Intact  Cognition: WNL  Prognosis:  Good   Screenings: None  Receiving Psychotherapy: Yes   Treatment Plan/Recommendations:   Plan:  1. Zoloft 50mg  to 100mg  2. Continue Adderall XR 30mg  every morning 3. Add Adderall 20mg  in the afternoon 4. Decrease Wellbutrin XL 300mg  to 150mg  every morning - denies seizures  Continue therapy  RTC 4 weeks  Patient advised to contact office with any questions, adverse effects, or acute worsening in signs and symptoms.     [name], NP

## 2019-04-17 ENCOUNTER — Telehealth: Payer: Self-pay | Admitting: Adult Health

## 2019-04-17 NOTE — Telephone Encounter (Signed)
Patient called looking for a PA on 20mg  of her adderall which was suppose to be sent to [place] by fax on Tuesday. I told patient to give the pharmacy are fax number here so that they will send it her.

## 2019-04-17 NOTE — Telephone Encounter (Signed)
Prior authorization submitted, waiting for response.

## 2019-04-29 ENCOUNTER — Other Ambulatory Visit: Payer: Self-pay | Admitting: Adult Health

## 2019-04-29 DIAGNOSIS — F331 Major depressive disorder, recurrent, moderate: Secondary | ICD-10-CM

## 2019-04-29 DIAGNOSIS — F411 Generalized anxiety disorder: Secondary | ICD-10-CM

## 2019-05-15 ENCOUNTER — Ambulatory Visit: Payer: BC Managed Care – PPO | Admitting: Adult Health

## 2019-05-19 ENCOUNTER — Other Ambulatory Visit: Payer: Self-pay

## 2019-05-19 ENCOUNTER — Encounter: Payer: Self-pay | Admitting: Adult Health

## 2019-05-19 ENCOUNTER — Ambulatory Visit (INDEPENDENT_AMBULATORY_CARE_PROVIDER_SITE_OTHER): Payer: BC Managed Care – PPO | Admitting: Adult Health

## 2019-05-19 DIAGNOSIS — G4700 Insomnia, unspecified: Secondary | ICD-10-CM

## 2019-05-19 DIAGNOSIS — F909 Attention-deficit hyperactivity disorder, unspecified type: Secondary | ICD-10-CM

## 2019-05-19 DIAGNOSIS — F331 Major depressive disorder, recurrent, moderate: Secondary | ICD-10-CM | POA: Diagnosis not present

## 2019-05-19 DIAGNOSIS — F411 Generalized anxiety disorder: Secondary | ICD-10-CM | POA: Diagnosis not present

## 2019-05-19 MED ORDER — AMPHETAMINE-DEXTROAMPHETAMINE 20 MG PO TABS: 20.0000 mg | ORAL_TABLET | Freq: Every day | ORAL | 0 refills | Status: DC

## 2019-05-19 MED ORDER — AMPHETAMINE-DEXTROAMPHET ER 30 MG PO CP24: 30.0000 mg | ORAL_CAPSULE | Freq: Every day | ORAL | 0 refills | Status: DC

## 2019-05-19 MED ORDER — BUPROPION HCL ER (XL) 150 MG PO TB24: 150.0000 mg | ORAL_TABLET | Freq: Every day | ORAL | 5 refills | Status: DC

## 2019-05-19 NOTE — Progress Notes (Signed)
[facility] MD/PA/NP Medication Check  05/19/2019 9:45 AM Meagan Austin  MRN:  [id number]  Chief Complaint:    HPI:   Referred by therapist - Margarita Grizzle in [place].  Describes mood today as "ok". Pleasant. Mood symptoms - reports depression and irritability. Feels like "anxiety is "way down". Feels "draggy". Decreased "crying episodes". Denies panic attacks - has moments where it is "harder to breathe". Stating "I feel better than I did". Also stating "I don't feel quite like myself yet". Struggles in the mornings to get motivated. Sex drive has decreased. Usually has "sex" more often. Decreased interest and motivation. Taking medications as prescribed.  Energy levels better lower in the mornings. Active, does not have a regular exercise routine. Works full-time 40 hours - working from home for next 2 weeks - Covid exposure. Enjoys some usual interests and activities. Spending time with family - husband and children. Married x 10 years. Has a 94 y/o daughter and a 56 y/o daughter. Parents local. Appetite adequate. Weight stable. Not "snacking as much". Sleeps better some nights than others. Stating "I'm more tired than I was". Averages 6 hours. Staying up late and working.  Focus and concentration have improved. Not feeling as distracted. Harder to get going in the mornings. Completing tasks. Managing aspects of household.  Denies SI or HI. Denies AH or VH.  Visit Diagnosis:  No diagnosis found.  Past Psychiatric History: Denies psychiatric admissions  Past Medical History:  Past Medical History:  Diagnosis Date  . Bell's palsy    with first pregnancy  . Irritable bowel syndrome (IBS) 06-29-11   Bilat ear infections, finished  Amoxicillin last weekend (06-24-11)  . Neuromuscular disorder ([place])    R side Bell's Palsy  . No pertinent past medical history   . Preterm labor     Past Surgical History:  Procedure Laterality Date  . CESAREAN SECTION  07/01/2011   Procedure: CESAREAN  SECTION;  Surgeon: Daria Pastures, MD;  Location: [facility];  Service: Gynecology;  Laterality: N/A;  . CHOLECYSTECTOMY    . CHOLECYSTECTOMY     2011  . NO PAST SURGERIES      Family Psychiatric History: Denies  Family History: No family history on file.  Social History:  Social History   Socioeconomic History  . Marital status: Married    Spouse name: Not on file  . Number of children: Not on file  . Years of education: Not on file  . Highest education level: Not on file  Occupational History  . Not on file  Social Needs  . Financial resource strain: Not on file  . Food insecurity    Worry: Not on file    Inability: Not on file  . Transportation needs    Medical: Not on file    Non-medical: Not on file  Tobacco Use  . Smoking status: Never Smoker  . Smokeless tobacco: Never Used  Substance and Sexual Activity  . Alcohol use: No  . Drug use: No  . Sexual activity: Yes    Birth control/protection: None  Lifestyle  . Physical activity    Days per week: Not on file    Minutes per session: Not on file  . Stress: Not on file  Relationships  . Social Herbalist on phone: Not on file    Gets together: Not on file    Attends religious service: Not on file    Active member of club or organization: Not on file  Attends meetings of clubs or organizations: Not on file    Relationship status: Not on file  Other Topics Concern  . Not on file  Social History Narrative  . Not on file    Allergies:  Allergies  Allergen Reactions  . Food Rash and Other (See Comments)    Allergy:  Pears   . Latex Rash    Metabolic Disorder Labs: No results found for: HGBA1C, MPG No results found for: PROLACTIN No results found for: CHOL, TRIG, HDL, CHOLHDL, VLDL, LDLCALC No results found for: TSH  Therapeutic Level Labs: No results found for: LITHIUM No results found for: VALPROATE No components found for:  CBMZ  Current Medications: Current Outpatient  Medications  Medication Sig Dispense Refill  . Alfalfa 500 MG TABS Take 4 tablets (2,000 mg total) by mouth daily. Start with one tablet and increase by one every 1-2 days  0  . amphetamine-dextroamphetamine (ADDERALL XR) 30 MG 24 hr capsule Take 1 capsule (30 mg total) by mouth daily. 30 capsule 0  . amphetamine-dextroamphetamine (ADDERALL) 20 MG tablet Take 1 tablet (20 mg total) by mouth daily. 30 tablet 0  . benzocaine-Menthol (DERMOPLAST) 20-0.5 % AERO Apply 1 application topically as needed for irritation (perineal discomfort).    Marland Kitchen buPROPion (WELLBUTRIN XL) 150 MG 24 hr tablet Take 1 tablet (150 mg total) by mouth daily. 30 tablet 5  . coconut oil OIL Apply 1 application topically as needed.  0  . magnesium oxide (MAG-OX) 400 (241.3 Mg) MG tablet Take 400 mg by mouth daily.    . Prenatal MV-Min-Fe Fum-FA-DHA [facility] PRENATAL EARLY SHIELD) 27-0.8 & 200 MG MISC Take 2 tablets by mouth daily.    . sertraline (ZOLOFT) 100 MG tablet Take 100 mg by mouth daily.    . sertraline (ZOLOFT) 100 MG tablet Take 1 tablet (100 mg total) by mouth daily. 30 tablet 5   No current facility-administered medications for this visit.     Medication Side Effects: none  Orders placed this visit:  No orders of the defined types were placed in this encounter.   Psychiatric Specialty Exam:  ROS  unknown if currently breastfeeding.There is no height or weight on file to calculate BMI.  General Appearance: Neat and Well Groomed  Eye Contact:  Good  Speech:  Clear and Coherent  Volume:  Normal  Mood:  Anxious, Depressed and Irritable  Affect:  Congruent  Thought Process:  Coherent  Orientation:  Full (Time, Place, and Person)  Thought Content: Logical   Suicidal Thoughts:  No  Homicidal Thoughts:  No  Memory:  WNL  Judgement:  Good  Insight:  Good  Psychomotor Activity:  Normal  Concentration:  Concentration: Good  Recall:  Good  Fund of Knowledge: Good  Language: Good  Assets:  Communication  Skills Desire for Improvement Financial Resources/Insurance Housing Intimacy Leisure Time Physical Health Resilience Social Support Talents/Skills Transportation Vocational/Educational  ADL's:  Intact  Cognition: WNL  Prognosis:  Good   Screenings: None  Receiving Psychotherapy: Yes   Treatment Plan/Recommendations:   Plan:  1. Zoloft 50mg  to 100mg  2. Continue Adderall XR 30mg  every morning 3. Add Adderall 20mg  in the afternoon 4. Decrease Wellbutrin XL 300mg  to 150mg  every morning - denies seizures  Continue therapy  RTC 4 weeks  Patient advised to contact office with any questions, adverse effects, or acute worsening in signs and symptoms.     , NP

## 2019-06-18 ENCOUNTER — Other Ambulatory Visit: Payer: Self-pay

## 2019-06-18 ENCOUNTER — Ambulatory Visit (INDEPENDENT_AMBULATORY_CARE_PROVIDER_SITE_OTHER): Payer: BC Managed Care – PPO | Admitting: Adult Health

## 2019-06-18 ENCOUNTER — Encounter: Payer: Self-pay | Admitting: Adult Health

## 2019-06-18 DIAGNOSIS — F411 Generalized anxiety disorder: Secondary | ICD-10-CM | POA: Diagnosis not present

## 2019-06-18 DIAGNOSIS — F909 Attention-deficit hyperactivity disorder, unspecified type: Secondary | ICD-10-CM

## 2019-06-18 DIAGNOSIS — G4700 Insomnia, unspecified: Secondary | ICD-10-CM

## 2019-06-18 DIAGNOSIS — F331 Major depressive disorder, recurrent, moderate: Secondary | ICD-10-CM | POA: Diagnosis not present

## 2019-06-18 MED ORDER — AMPHETAMINE-DEXTROAMPHETAMINE 20 MG PO TABS: 20.0000 mg | ORAL_TABLET | Freq: Every day | ORAL | 0 refills | Status: DC

## 2019-06-18 MED ORDER — AMPHETAMINE-DEXTROAMPHET ER 30 MG PO CP24: 30.0000 mg | ORAL_CAPSULE | Freq: Every day | ORAL | 0 refills | Status: DC

## 2019-06-18 NOTE — Progress Notes (Signed)
[facility] MD/PA/NP Medication Check  06/18/2019 5:13 PM Meagan Austin  MRN:  [id number]  Chief Complaint:    HPI:   Referred by therapist - Jerelyn Charles in [place].  Describes mood today as "ok". Pleasant. Denies tearfulness. Mood symptoms - reports decreased depression, anxiety, and irritability. Stating "I'm headed in a much better direction". Not feeling as fatigued and "draggy". Denies panic attacks. Husband has commented on her progress. Some concerns about "decreased" sex drive. Decreased interest and motivation. Taking medications as prescribed.  Energy levels better in the mornings - decreased as afternoon progresses. Active, does not have a regular exercise routine. 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  Enjoys some usual interests and activities. Married. Spending time with family - husband and 2 children. Married x 10 years. Has a 79 y/o daughter and a 2 y/o son. Parents local. Appetite adequate. Weight stable.  Sleeps better some nights than others. Averages 6 to 8 hours.  Focus and concentration has improved overall. Does not feel like medication is lasting long enough. Completing tasks. Managing aspects of household.  Denies SI or HI. Denies AH or VH.  Visit Diagnosis:    ICD-10-CM   1. Insomnia, unspecified type  G47.00   2. Attention deficit hyperactivity disorder (ADHD), unspecified ADHD type  F90.9 amphetamine-dextroamphetamine (ADDERALL XR) 30 MG 24 hr capsule    amphetamine-dextroamphetamine (ADDERALL) 20 MG tablet  3. Major depressive disorder, recurrent episode, moderate (HCC)  F33.1   4. Generalized anxiety disorder  F41.1     Past Psychiatric History: Denies psychiatric admissions  Past Medical History:  Past Medical History:  Diagnosis Date  . Bell's palsy    with first pregnancy  . Irritable bowel syndrome (IBS) 06-29-11   Bilat ear infections, finished  Amoxicillin last weekend (06-24-11)  . Neuromuscular disorder ([facility])    R side Bell's Palsy  . No  pertinent past medical history   . Preterm labor     Past Surgical History:  Procedure Laterality Date  . CESAREAN SECTION  07/01/2011   Procedure: CESAREAN SECTION;  Surgeon: Loney Laurence, MD;  Location: [facility];  Service: Gynecology;  Laterality: N/A;  . CHOLECYSTECTOMY    . CHOLECYSTECTOMY     2011  . NO PAST SURGERIES      Family Psychiatric History: Denies  Family History: No family history on file.  Social History:  Social History   Socioeconomic History  . Marital status: Married    Spouse name: Not on file  . Number of children: Not on file  . Years of education: Not on file  . Highest education level: Not on file  Occupational History  . Not on file  Social Needs  . Financial resource strain: Not on file  . Food insecurity    Worry: Not on file    Inability: Not on file  . Transportation needs    Medical: Not on file    Non-medical: Not on file  Tobacco Use  . Smoking status: Never Smoker  . Smokeless tobacco: Never Used  Substance and Sexual Activity  . Alcohol use: No  . Drug use: No  . Sexual activity: Yes    Birth control/protection: None  Lifestyle  . Physical activity    Days per week: Not on file    Minutes per session: Not on file  . Stress: Not on file  Relationships  . Social Musician on phone: Not on file    Gets together: Not on file  Attends religious service: Not on file    Active member of club or organization: Not on file    Attends meetings of clubs or organizations: Not on file    Relationship status: Not on file  Other Topics Concern  . Not on file  Social History Narrative  . Not on file    Allergies:  Allergies  Allergen Reactions  . Food Rash and Other (See Comments)    Allergy:  Pears   . Latex Rash    Metabolic Disorder Labs: No results found for: HGBA1C, MPG No results found for: PROLACTIN No results found for: CHOL, TRIG, HDL, CHOLHDL, VLDL, LDLCALC No results found for: TSH  Therapeutic  Level Labs: No results found for: LITHIUM No results found for: VALPROATE No components found for:  CBMZ  Current Medications: Current Outpatient Medications  Medication Sig Dispense Refill  . Alfalfa 500 MG TABS Take 4 tablets (2,000 mg total) by mouth daily. Start with one tablet and increase by one every 1-2 days  0  . amphetamine-dextroamphetamine (ADDERALL XR) 30 MG 24 hr capsule Take 1 capsule (30 mg total) by mouth daily. 30 capsule 0  . amphetamine-dextroamphetamine (ADDERALL) 20 MG tablet Take 1 tablet (20 mg total) by mouth daily. 30 tablet 0  . benzocaine-Menthol (DERMOPLAST) 20-0.5 % AERO Apply 1 application topically as needed for irritation (perineal discomfort).    Marland Kitchen buPROPion (WELLBUTRIN XL) 150 MG 24 hr tablet Take 1 tablet (150 mg total) by mouth daily. 30 tablet 5  . coconut oil OIL Apply 1 application topically as needed.  0  . magnesium oxide (MAG-OX) 400 (241.3 Mg) MG tablet Take 400 mg by mouth daily.    . Prenatal MV-Min-Fe Fum-FA-DHA [facility] PRENATAL EARLY SHIELD) 27-0.8 & 200 MG MISC Take 2 tablets by mouth daily.    . sertraline (ZOLOFT) 100 MG tablet Take 1 tablet (100 mg total) by mouth daily. 30 tablet 5   No current facility-administered medications for this visit.     Medication Side Effects: none  Orders placed this visit:  No orders of the defined types were placed in this encounter.   Psychiatric Specialty Exam:  Review of Systems  Musculoskeletal: Negative for falls.  Neurological: Negative for seizures and weakness.  Psychiatric/Behavioral: Negative for depression and suicidal ideas. The patient is not nervous/anxious and does not have insomnia.     unknown if currently breastfeeding.There is no height or weight on file to calculate BMI.  General Appearance: Neat and Well Groomed  Eye Contact:  Good  Speech:  Clear and Coherent  Volume:  Normal  Mood:  Euthymic  Affect:  Congruent  Thought Process:  Coherent and Descriptions of  Associations: Intact  Orientation:  Full (Time, Place, and Person)  Thought Content: Logical   Suicidal Thoughts:  No  Homicidal Thoughts:  No  Memory:  WNL  Judgement:  Good  Insight:  Good  Psychomotor Activity:  Normal  Concentration:  Concentration: Good  Recall:  Good  Fund of Knowledge: Good  Language: Good  Assets:  Communication Skills Desire for Improvement Financial Resources/Insurance Housing Intimacy Leisure Time Physical Health Resilience Social Support Talents/Skills Transportation Vocational/Educational  ADL's:  Intact  Cognition: WNL  Prognosis:  Good   Screenings: None  Receiving Psychotherapy: Yes   Treatment Plan/Recommendations:   Plan:  1. Zoloft 100mg  daily 2. Adderall XR 30mg  every morning 3. Adderall 20mg  in the afternoon 4. Wellbutrin XL 150mg  every morning - denies seizures  Continue therapy  RTC 4 weeks  Patient advised to contact office with any questions, adverse effects, or acute worsening in signs and symptoms.   Dorothyann Gibbsegina N Dennette Faulconer, NP

## 2019-07-18 ENCOUNTER — Other Ambulatory Visit: Payer: Self-pay

## 2019-07-18 ENCOUNTER — Telehealth: Payer: Self-pay | Admitting: Adult Health

## 2019-07-18 DIAGNOSIS — F909 Attention-deficit hyperactivity disorder, unspecified type: Secondary | ICD-10-CM

## 2019-07-18 MED ORDER — AMPHETAMINE-DEXTROAMPHET ER 30 MG PO CP24: 30.0000 mg | ORAL_CAPSULE | Freq: Every day | ORAL | 0 refills | Status: DC

## 2019-07-18 MED ORDER — AMPHETAMINE-DEXTROAMPHETAMINE 20 MG PO TABS: 20.0000 mg | ORAL_TABLET | Freq: Every day | ORAL | 0 refills | Status: DC

## 2019-07-18 NOTE — Telephone Encounter (Signed)
Pt called to schedule 1/11 next appt, requesting refill for Adderall XR 30 mg and 20 mg at [facility] [place]

## 2019-07-18 NOTE — Telephone Encounter (Signed)
Last refill 06/18/2019 Pended for Dr. Cottle to submit 

## 2019-07-21 ENCOUNTER — Ambulatory Visit: Payer: BC Managed Care – PPO | Admitting: Adult Health

## 2019-08-12 ENCOUNTER — Other Ambulatory Visit: Payer: Self-pay | Admitting: Adult Health

## 2019-08-12 DIAGNOSIS — F331 Major depressive disorder, recurrent, moderate: Secondary | ICD-10-CM

## 2019-08-12 DIAGNOSIS — G4700 Insomnia, unspecified: Secondary | ICD-10-CM

## 2019-08-12 DIAGNOSIS — F411 Generalized anxiety disorder: Secondary | ICD-10-CM

## 2019-08-16 ENCOUNTER — Other Ambulatory Visit: Payer: Self-pay | Admitting: Adult Health

## 2019-08-16 DIAGNOSIS — F331 Major depressive disorder, recurrent, moderate: Secondary | ICD-10-CM

## 2019-08-16 DIAGNOSIS — F411 Generalized anxiety disorder: Secondary | ICD-10-CM

## 2019-08-18 ENCOUNTER — Other Ambulatory Visit: Payer: Self-pay

## 2019-08-18 ENCOUNTER — Telehealth: Payer: Self-pay | Admitting: Adult Health

## 2019-08-18 DIAGNOSIS — F909 Attention-deficit hyperactivity disorder, unspecified type: Secondary | ICD-10-CM

## 2019-08-18 MED ORDER — AMPHETAMINE-DEXTROAMPHETAMINE 20 MG PO TABS: 20.0000 mg | ORAL_TABLET | Freq: Every day | ORAL | 0 refills | Status: DC

## 2019-08-18 MED ORDER — AMPHETAMINE-DEXTROAMPHET ER 30 MG PO CP24: 30.0000 mg | ORAL_CAPSULE | Freq: Every day | ORAL | 0 refills | Status: DC

## 2019-08-18 NOTE — Telephone Encounter (Signed)
Pt stated on her 1/11 appt Rene Kocher was running behind, and when she did call she had already returned to work. She is not clear if she need to reschedule for now or 65mos. Please advise. She is requesting a refill on both Adderall prescriptions. [facility] [place]

## 2019-09-11 ENCOUNTER — Other Ambulatory Visit: Payer: Self-pay | Admitting: Adult Health

## 2019-09-11 DIAGNOSIS — F411 Generalized anxiety disorder: Secondary | ICD-10-CM

## 2019-09-11 DIAGNOSIS — F331 Major depressive disorder, recurrent, moderate: Secondary | ICD-10-CM

## 2019-09-16 ENCOUNTER — Other Ambulatory Visit: Payer: Self-pay

## 2019-09-16 ENCOUNTER — Telehealth: Payer: Self-pay | Admitting: Adult Health

## 2019-09-16 DIAGNOSIS — F909 Attention-deficit hyperactivity disorder, unspecified type: Secondary | ICD-10-CM

## 2019-09-16 MED ORDER — AMPHETAMINE-DEXTROAMPHET ER 30 MG PO CP24: 30.0000 mg | ORAL_CAPSULE | Freq: Every day | ORAL | 0 refills | Status: DC

## 2019-09-16 MED ORDER — AMPHETAMINE-DEXTROAMPHETAMINE 20 MG PO TABS: 20.0000 mg | ORAL_TABLET | Freq: Every day | ORAL | 0 refills | Status: DC

## 2019-09-16 NOTE — Telephone Encounter (Signed)
Last refill 08/18/2019, pended for Rene Kocher to submit Has apt 10/15/2019

## 2019-09-16 NOTE — Telephone Encounter (Signed)
Pt would like a refill on Adderall xr, Adderall IR. [facility] [place]

## 2019-10-15 ENCOUNTER — Ambulatory Visit: Payer: BC Managed Care – PPO | Admitting: Adult Health

## 2019-10-21 ENCOUNTER — Other Ambulatory Visit: Payer: Self-pay

## 2019-10-21 ENCOUNTER — Telehealth: Payer: Self-pay | Admitting: Adult Health

## 2019-10-21 DIAGNOSIS — F909 Attention-deficit hyperactivity disorder, unspecified type: Secondary | ICD-10-CM

## 2019-10-21 MED ORDER — AMPHETAMINE-DEXTROAMPHETAMINE 20 MG PO TABS: 20.0000 mg | ORAL_TABLET | Freq: Every day | ORAL | 0 refills | Status: DC

## 2019-10-21 MED ORDER — AMPHETAMINE-DEXTROAMPHET ER 30 MG PO CP24: 30.0000 mg | ORAL_CAPSULE | Freq: Every day | ORAL | 0 refills | Status: DC

## 2019-10-21 NOTE — Telephone Encounter (Signed)
Last refill 09/16/2019, pended for Meagan Austin to submit

## 2019-10-21 NOTE — Telephone Encounter (Signed)
Meagan Austin called to RS the appt we had to cancel on her.  She is now scheduled for 11/04/19.  But she does need refills now on her two Adderall prescriptions.  [facility] [place] [place]

## 2019-11-06 ENCOUNTER — Ambulatory Visit: Payer: BC Managed Care – PPO | Admitting: Adult Health

## 2019-11-19 ENCOUNTER — Telehealth (INDEPENDENT_AMBULATORY_CARE_PROVIDER_SITE_OTHER): Payer: BC Managed Care – PPO | Admitting: Adult Health

## 2019-11-19 DIAGNOSIS — F411 Generalized anxiety disorder: Secondary | ICD-10-CM

## 2019-11-19 DIAGNOSIS — F909 Attention-deficit hyperactivity disorder, unspecified type: Secondary | ICD-10-CM | POA: Diagnosis not present

## 2019-11-19 DIAGNOSIS — F331 Major depressive disorder, recurrent, moderate: Secondary | ICD-10-CM

## 2019-11-19 DIAGNOSIS — G4700 Insomnia, unspecified: Secondary | ICD-10-CM | POA: Diagnosis not present

## 2019-11-19 MED ORDER — AMPHETAMINE-DEXTROAMPHET ER 30 MG PO CP24: 30.0000 mg | ORAL_CAPSULE | Freq: Two times a day (BID) | ORAL | 0 refills | Status: DC

## 2019-11-19 MED ORDER — BUPROPION HCL ER (XL) 150 MG PO TB24: 150.0000 mg | ORAL_TABLET | Freq: Every day | ORAL | 0 refills | Status: DC

## 2019-11-19 MED ORDER — SERTRALINE HCL 100 MG PO TABS: 100.0000 mg | ORAL_TABLET | Freq: Every day | ORAL | 2 refills | Status: DC

## 2019-11-19 NOTE — Progress Notes (Signed)
[name] [id number] [birth year] 29 y.o.  Subjective:   Patient ID:  Meagan Austin is a 29 y.o. (DOB [birth year]) female.  Chief Complaint: No chief complaint on file.   HPI Meagan Austin presents to the office today for follow-up of MDD, GAD, insomnia, and ADHD.   Referred by therapist - Margarita Grizzle in [place].  Describes mood today as "ok". Pleasant. Denies tearfulness. Mood symptoms - reports decreased anxiety, depression, and irritability. Reports a "few" panic attacks. Stating "I'm doing pretty good for the most part". Denies sexual side effects with addition of Wellbutrin XL 14m. Some days motivated and others wanting to do nothing. Decreased interest and motivation. Taking medications as prescribed.  Energy levels better in the mornings. Active, does not have a regular exercise routine. Works full-time 40 hours. Enjoys some usual interests and activities. Married. Lives with husband of 145years and 717y/o daughter and a 278y/o daughter. Parents local. Spending time with family.  Appetite adequate. Weight stable.  Sleeps well most nights. Averages 8 hours.  Focus and concentration have improved. Completing tasks. Managing aspects of household. Work going well. Afternoon dose of Adderall wearing off before her day ends.  Denies SI or HI. Denies AH or VH.  Review of Systems:  Review of Systems  Musculoskeletal: Negative for gait problem.  Neurological: Negative for tremors.  Psychiatric/Behavioral:       Please refer to HPI    Medications: I have reviewed the patient's current medications.  Current Outpatient Medications  Medication Sig Dispense Refill  . Alfalfa 500 MG TABS Take 4 tablets (2,000 mg total) by mouth daily. Start with one tablet and increase by one every 1-2 days  0  . amphetamine-dextroamphetamine (ADDERALL XR) 30 MG 24 hr capsule Take 1 capsule (30 mg total) by mouth 2 (two) times daily. 60 capsule 0  . [START ON 12/17/2019] amphetamine-dextroamphetamine  (ADDERALL XR) 30 MG 24 hr capsule Take 1 capsule (30 mg total) by mouth 2 (two) times daily. 60 capsule 0  . [START ON 01/14/2020] amphetamine-dextroamphetamine (ADDERALL XR) 30 MG 24 hr capsule Take 1 capsule (30 mg total) by mouth 2 (two) times daily. 60 capsule 0  . amphetamine-dextroamphetamine (ADDERALL) 20 MG tablet Take 1 tablet (20 mg total) by mouth daily. 30 tablet 0  . benzocaine-Menthol (DERMOPLAST) 20-0.5 % AERO Apply 1 application topically as needed for irritation (perineal discomfort).    .Marland KitchenbuPROPion (WELLBUTRIN XL) 150 MG 24 hr tablet Take 1 tablet (150 mg total) by mouth daily. 90 tablet 0  . coconut oil OIL Apply 1 application topically as needed.  0  . magnesium oxide (MAG-OX) 400 (241.3 Mg) MG tablet Take 400 mg by mouth daily.    . Prenatal MV-Min-Fe Fum-FA-DHA ([facility]EN[facility]LY SHIELD) 27-0.8 & 200 MG MISC Take 2 tablets by mouth daily.    . sertraline (ZOLOFT) 100 MG tablet Take 1 tablet (100 mg total) by mouth daily. 90 tablet 2   No current facility-administered medications for this visit.    Medication Side Effects: None  Allergies:  Allergies  Allergen Reactions  . Food Rash and Other (See Comments)    Allergy:  Pears   . Latex Rash    Past Medical History:  Diagnosis Date  . Bell's palsy    with first pregnancy  . Irritable bowel syndrome (IBS) 06-29-11   Bilat ear infections, finished  Amoxicillin last weekend (06-24-11)  . Neuromuscular disorder ([place]    R side Bell's Palsy  . No pertinent past  medical history   . Preterm labor     No family history on file.  Social History   Socioeconomic History  . Marital status: Married    Spouse name: Not on file  . Number of children: Not on file  . Years of education: Not on file  . Highest education level: Not on file  Occupational History  . Not on file  Tobacco Use  . Smoking status: Never Smoker  . Smokeless tobacco: Never Used  Substance and Sexual Activity  . Alcohol use: No  . Drug  use: No  . Sexual activity: Yes    Birth control/protection: None  Other Topics Concern  . Not on file  Social History Narrative  . Not on file   Social Determinants of Health   Financial Resource Strain:   . Difficulty of Paying Living Expenses:   Food Insecurity:   . Worried About Charity fundraiser in the Last Year:   . Arboriculturist in the Last Year:   Transportation Needs:   . Film/video editor (Medical):   Marland Kitchen Lack of Transportation (Non-Medical):   Physical Activity:   . Days of Exercise per Week:   . Minutes of Exercise per Session:   Stress:   . Feeling of Stress :   Social Connections:   . Frequency of Communication with Friends and Family:   . Frequency of Social Gatherings with Friends and Family:   . Attends Religious Services:   . Active Member of Clubs or Organizations:   . Attends Archivist Meetings:   Marland Kitchen Marital Status:   Intimate Partner Violence:   . Fear of Current or Ex-Partner:   . Emotionally Abused:   Marland Kitchen Physically Abused:   . Sexually Abused:     Past Medical History, Surgical history, Social history, and Family history were reviewed and updated as appropriate.   Please see review of systems for further details on the patient's review from today.   Objective:   Physical Exam:  There were no vitals taken for this visit.  Physical Exam Neurological:     Mental Status: She is alert and oriented to person, place, and time.     Cranial Nerves: No dysarthria.  Psychiatric:        Attention and Perception: Attention and perception normal.        Mood and Affect: Mood normal.        Speech: Speech normal.        Behavior: Behavior is cooperative.        Thought Content: Thought content normal. Thought content is not paranoid or delusional. Thought content does not include homicidal or suicidal ideation. Thought content does not include homicidal or suicidal plan.        Cognition and Memory: Cognition and memory normal.         Judgment: Judgment normal.     Comments: Insight intact     Lab Review:     Component Value Date/Time   NA 135 12/06/2010 1222   K 3.9 12/06/2010 1222   CL 99 12/06/2010 1222   CO2 22 12/06/2010 1222   GLUCOSE 80 12/06/2010 1222   BUN 8 12/06/2010 1222   CREATININE 0.55 12/06/2010 1222   CALCIUM 9.7 12/06/2010 1222   PROT 7.2 12/06/2010 1222   ALBUMIN 3.6 12/06/2010 1222   AST 18 12/06/2010 1222   ALT 14 12/06/2010 1222   ALKPHOS 67 12/06/2010 1222   BILITOT 0.3 12/06/2010 1222  GFRNONAA >60 12/06/2010 1222   GFRAA  12/06/2010 1222    >60        The eGFR has been calculated using the MDRD equation. This calculation has not been validated in all clinical situations. eGFR's persistently <60 mL/min signify possible Chronic Kidney Disease.       Component Value Date/Time   WBC 7.5 06/23/2016 0530   RBC 2.95 (L) 06/23/2016 0530   HGB 9.4 (L) 06/23/2016 0530   HCT 27.1 (L) 06/23/2016 0530   PLT 164 06/23/2016 0530   MCV 91.9 06/23/2016 0530   MCH 31.9 06/23/2016 0530   MCHC 34.7 06/23/2016 0530   RDW 15.9 (H) 06/23/2016 0530    No results found for: POCLITH, LITHIUM   No results found for: PHENYTOIN, PHENOBARB, VALPROATE, CBMZ   .res Assessment: Plan:    Plan:  1. Zoloft 19m daily 2. Adderall XR 380mevery morning to BID 3. D/C Adderall 2034mn the afternoon 4. Wellbutrin XL 150m76mery morning - denies seizures  Continue therapy  RTC 4 weeks  Patient advised to contact office with any questions, adverse effects, or acute worsening in signs and symptoms.  Diagnoses and all orders for this visit:  Generalized anxiety disorder -     buPROPion (WELLBUTRIN XL) 150 MG 24 hr tablet; Take 1 tablet (150 mg total) by mouth daily. -     sertraline (ZOLOFT) 100 MG tablet; Take 1 tablet (100 mg total) by mouth daily.  Major depressive disorder, recurrent episode, moderate (HCC) -     buPROPion (WELLBUTRIN XL) 150 MG 24 hr tablet; Take 1 tablet (150 mg  total) by mouth daily. -     sertraline (ZOLOFT) 100 MG tablet; Take 1 tablet (100 mg total) by mouth daily.  Insomnia, unspecified type -     sertraline (ZOLOFT) 100 MG tablet; Take 1 tablet (100 mg total) by mouth daily.  Attention deficit hyperactivity disorder (ADHD), unspecified ADHD type -     amphetamine-dextroamphetamine (ADDERALL XR) 30 MG 24 hr capsule; Take 1 capsule (30 mg total) by mouth 2 (two) times daily. -     amphetamine-dextroamphetamine (ADDERALL XR) 30 MG 24 hr capsule; Take 1 capsule (30 mg total) by mouth 2 (two) times daily. -     amphetamine-dextroamphetamine (ADDERALL XR) 30 MG 24 hr capsule; Take 1 capsule (30 mg total) by mouth 2 (two) times daily.     Please see After Visit Summary for patient specific instructions.  No future appointments.  No orders of the defined types were placed in this encounter.   -------------------------------

## 2019-12-23 ENCOUNTER — Telehealth: Payer: Self-pay | Admitting: Adult Health

## 2019-12-25 NOTE — Telephone Encounter (Signed)
error 

## 2020-02-27 ENCOUNTER — Telehealth: Payer: Self-pay | Admitting: Adult Health

## 2020-02-27 NOTE — Telephone Encounter (Signed)
Pt requesting refill for Adderall @ [facility] [place], [place]

## 2020-03-01 ENCOUNTER — Other Ambulatory Visit: Payer: Self-pay

## 2020-03-01 DIAGNOSIS — F909 Attention-deficit hyperactivity disorder, unspecified type: Secondary | ICD-10-CM

## 2020-03-01 MED ORDER — AMPHETAMINE-DEXTROAMPHET ER 30 MG PO CP24: 30.0000 mg | ORAL_CAPSULE | Freq: Two times a day (BID) | ORAL | 0 refills | Status: DC

## 2020-03-01 NOTE — Telephone Encounter (Signed)
Pt made appt for 8/31.

## 2020-03-01 NOTE — Telephone Encounter (Signed)
Pended for Gina to send 

## 2020-03-09 ENCOUNTER — Telehealth (INDEPENDENT_AMBULATORY_CARE_PROVIDER_SITE_OTHER): Payer: BC Managed Care – PPO | Admitting: Adult Health

## 2020-03-09 ENCOUNTER — Encounter: Payer: Self-pay | Admitting: Adult Health

## 2020-03-09 DIAGNOSIS — F331 Major depressive disorder, recurrent, moderate: Secondary | ICD-10-CM | POA: Diagnosis not present

## 2020-03-09 DIAGNOSIS — F909 Attention-deficit hyperactivity disorder, unspecified type: Secondary | ICD-10-CM

## 2020-03-09 DIAGNOSIS — G4700 Insomnia, unspecified: Secondary | ICD-10-CM

## 2020-03-09 DIAGNOSIS — F411 Generalized anxiety disorder: Secondary | ICD-10-CM

## 2020-03-09 MED ORDER — AMPHETAMINE-DEXTROAMPHET ER 30 MG PO CP24: 30.0000 mg | ORAL_CAPSULE | Freq: Two times a day (BID) | ORAL | 0 refills | Status: DC

## 2020-03-09 MED ORDER — SERTRALINE HCL 100 MG PO TABS: 100.0000 mg | ORAL_TABLET | Freq: Every day | ORAL | 2 refills | Status: DC

## 2020-03-09 MED ORDER — BUPROPION HCL ER (XL) 150 MG PO TB24: 150.0000 mg | ORAL_TABLET | Freq: Every day | ORAL | 0 refills | Status: DC

## 2020-03-09 NOTE — Progress Notes (Signed)
Meagan Austin [id number] Jun 05, 1991 29 y.o.  Virtual Visit via Telephone Note  I connected with pt on 03/09/20 at  5:20 PM EDT by telephone and verified that I am speaking with the correct person using two identifiers.   I discussed the limitations, risks, security and privacy concerns of performing an evaluation and management service by telephone and the availability of in person appointments. I also discussed with the patient that there may be a patient responsible charge related to this service. The patient expressed understanding and agreed to proceed.   I discussed the assessment and treatment plan with the patient. The patient was provided an opportunity to ask questions and all were answered. The patient agreed with the plan and demonstrated an understanding of the instructions.   The patient was advised to call back or seek an in-person evaluation if the symptoms worsen or if the condition fails to improve as anticipated.  I provided 30 minutes of non-face-to-face time during this encounter.  The patient was located at home.  The provider was located at [place].   [name], NP   Subjective:   Patient ID:  Meagan Austin is a 28 y.o. (DOB Apr 05, 1991) female.  Chief Complaint: No chief complaint on file.   HPI Ivorie Uplinger presents for follow-up of MDD, GAD, insomnia, and ADHD.   Referred by therapist - Margarita Grizzle in [place].  Describes mood today as "ok". Pleasant. Denies tearfulness. Mood symptoms - reports decreased anxiety, depression, and irritability. Denies panic attacks. Stating "I've been doing really good". Feels like mood has "leveled" out. Adderall is working well at 42m BID. Diagnosed with Covid recently. Hospitalized over the weekend. Now recovering at home. Stable interest and motivation. Taking medications as prescribed.  Energy levels low. Active, does not have a regular exercise routine.  Enjoys some usual interests and activities.  Married. Lives with husband of 169years and 872y/o daughter and a 376y/o son. Parents local. Spending time with family.  Appetite adequate. Weight stable.  Sleeps well most nights. Averages 8 hours.  Focus and concentration stable. Completing tasks. Managing aspects of household. Work going well -Games developer    Denies SI or HI. Denies AH or VH.    Review of Systems:  Review of Systems  Musculoskeletal: Negative for gait problem.  Neurological: Negative for tremors.  Psychiatric/Behavioral:       Please refer to HPI    Medications: I have reviewed the patient's current medications.  Current Outpatient Medications  Medication Sig Dispense Refill  . Alfalfa 500 MG TABS Take 4 tablets (2,000 mg total) by mouth daily. Start with one tablet and increase by one every 1-2 days  0  . amphetamine-dextroamphetamine (ADDERALL XR) 30 MG 24 hr capsule Take 1 capsule (30 mg total) by mouth 2 (two) times daily. 60 capsule 0  . [START ON 04/06/2020] amphetamine-dextroamphetamine (ADDERALL XR) 30 MG 24 hr capsule Take 1 capsule (30 mg total) by mouth 2 (two) times daily. 60 capsule 0  . [START ON 05/04/2020] amphetamine-dextroamphetamine (ADDERALL XR) 30 MG 24 hr capsule Take 1 capsule (30 mg total) by mouth 2 (two) times daily. 60 capsule 0  . amphetamine-dextroamphetamine (ADDERALL) 20 MG tablet Take 1 tablet (20 mg total) by mouth daily. 30 tablet 0  . benzocaine-Menthol (DERMOPLAST) 20-0.5 % AERO Apply 1 application topically as needed for irritation (perineal discomfort).    .Marland KitchenbuPROPion (WELLBUTRIN XL) 150 MG 24 hr tablet Take 1 tablet (150 mg total) by mouth daily. 9[place]  tablet 0  . coconut oil OIL Apply 1 application topically as needed.  0  . magnesium oxide (MAG-OX) 400 (241.3 Mg) MG tablet Take 400 mg by mouth daily.    . Prenatal MV-Min-Fe Fum-FA-DHA [facility] PRENATAL EARLY SHIELD) 27-0.8 & 200 MG MISC Take 2 tablets by mouth daily.    . sertraline (ZOLOFT) 100 MG tablet Take 1 tablet (100 mg total)  by mouth daily. 90 tablet 2   No current facility-administered medications for this visit.    Medication Side Effects: None  Allergies:  Allergies  Allergen Reactions  . Food Rash and Other (See Comments)    Allergy:  Pears   . Latex Rash    Past Medical History:  Diagnosis Date  . Bell's palsy    with first pregnancy  . Irritable bowel syndrome (IBS) 06-29-11   Bilat ear infections, finished  Amoxicillin last weekend (06-24-11)  . Neuromuscular disorder ([place])    R side Bell's Palsy  . No pertinent past medical history   . Preterm labor     No family history on file.  Social History   Socioeconomic History  . Marital status: Married    Spouse name: Not on file  . Number of children: Not on file  . Years of education: Not on file  . Highest education level: Not on file  Occupational History  . Not on file  Tobacco Use  . Smoking status: Never Smoker  . Smokeless tobacco: Never Used  Substance and Sexual Activity  . Alcohol use: No  . Drug use: No  . Sexual activity: Yes    Birth control/protection: None  Other Topics Concern  . Not on file  Social History Narrative  . Not on file   Social Determinants of Health   Financial Resource Strain:   . Difficulty of Paying Living Expenses: Not on file  Food Insecurity:   . Worried About Charity fundraiser in the Last Year: Not on file  . Ran Out of Food in the Last Year: Not on file  Transportation Needs:   . Lack of Transportation (Medical): Not on file  . Lack of Transportation (Non-Medical): Not on file  Physical Activity:   . Days of Exercise per Week: Not on file  . Minutes of Exercise per Session: Not on file  Stress:   . Feeling of Stress : Not on file  Social Connections:   . Frequency of Communication with Friends and Family: Not on file  . Frequency of Social Gatherings with Friends and Family: Not on file  . Attends Religious Services: Not on file  . Active Member of Clubs or Organizations: Not  on file  . Attends Archivist Meetings: Not on file  . Marital Status: Not on file  Intimate Partner Violence:   . Fear of Current or Ex-Partner: Not on file  . Emotionally Abused: Not on file  . Physically Abused: Not on file  . Sexually Abused: Not on file    Past Medical History, Surgical history, Social history, and Family history were reviewed and updated as appropriate.   Please see review of systems for further details on the patient's review from today.   Objective:   Physical Exam:  There were no vitals taken for this visit.  Physical Exam Neurological:     Mental Status: She is alert and oriented to person, place, and time.     Cranial Nerves: No dysarthria.  Psychiatric:  Attention and Perception: Attention and perception normal.        Mood and Affect: Mood normal.        Speech: Speech normal.        Behavior: Behavior is cooperative.        Thought Content: Thought content normal. Thought content is not paranoid or delusional. Thought content does not include homicidal or suicidal ideation. Thought content does not include homicidal or suicidal plan.        Cognition and Memory: Cognition and memory normal.        Judgment: Judgment normal.     Comments: Insight intact     Lab Review:     Component Value Date/Time   NA 135 12/06/2010 1222   K 3.9 12/06/2010 1222   CL 99 12/06/2010 1222   CO2 22 12/06/2010 1222   GLUCOSE 80 12/06/2010 1222   BUN 8 12/06/2010 1222   CREATININE 0.55 12/06/2010 1222   CALCIUM 9.7 12/06/2010 1222   PROT 7.2 12/06/2010 1222   ALBUMIN 3.6 12/06/2010 1222   AST 18 12/06/2010 1222   ALT 14 12/06/2010 1222   ALKPHOS 67 12/06/2010 1222   BILITOT 0.3 12/06/2010 1222   GFRNONAA >60 12/06/2010 1222   GFRAA  12/06/2010 1222    >60        The eGFR has been calculated using the MDRD equation. This calculation has not been validated in all clinical situations. eGFR's persistently <60 mL/min signify possible  Chronic Kidney Disease.       Component Value Date/Time   WBC 7.5 06/23/2016 0530   RBC 2.95 (L) 06/23/2016 0530   HGB 9.4 (L) 06/23/2016 0530   HCT 27.1 (L) 06/23/2016 0530   PLT 164 06/23/2016 0530   MCV 91.9 06/23/2016 0530   MCH 31.9 06/23/2016 0530   MCHC 34.7 06/23/2016 0530   RDW 15.9 (H) 06/23/2016 0530    No results found for: POCLITH, LITHIUM   No results found for: PHENYTOIN, PHENOBARB, VALPROATE, CBMZ   .res Assessment: Plan:    Plan:  1. Zoloft 179m daily 2. Adderall XR 389mevery morning to BID 3. Wellbutrin XL 15031mvery morning - denies seizures  Continue therapy  RTC 3 months  Patient advised to contact office with any questions, adverse effects, or acute worsening in signs and symptoms.  Discussed potential benefits, risks, and side effects of stimulants with patient to include increased heart rate, palpitations, insomnia, increased anxiety, increased irritability, or decreased appetite.  Instructed patient to contact office if experiencing any significant tolerability issues.   Diagnoses and all orders for this visit:  Attention deficit hyperactivity disorder (ADHD), unspecified ADHD type -     amphetamine-dextroamphetamine (ADDERALL XR) 30 MG 24 hr capsule; Take 1 capsule (30 mg total) by mouth 2 (two) times daily. -     amphetamine-dextroamphetamine (ADDERALL XR) 30 MG 24 hr capsule; Take 1 capsule (30 mg total) by mouth 2 (two) times daily. -     amphetamine-dextroamphetamine (ADDERALL XR) 30 MG 24 hr capsule; Take 1 capsule (30 mg total) by mouth 2 (two) times daily.  Insomnia, unspecified type -     sertraline (ZOLOFT) 100 MG tablet; Take 1 tablet (100 mg total) by mouth daily.  Major depressive disorder, recurrent episode, moderate (HCC) -     buPROPion (WELLBUTRIN XL) 150 MG 24 hr tablet; Take 1 tablet (150 mg total) by mouth daily. -     sertraline (ZOLOFT) 100 MG tablet; Take 1 tablet (100 mg total) by  mouth daily.  Generalized anxiety  disorder -     buPROPion (WELLBUTRIN XL) 150 MG 24 hr tablet; Take 1 tablet (150 mg total) by mouth daily. -     sertraline (ZOLOFT) 100 MG tablet; Take 1 tablet (100 mg total) by mouth daily.    Please see After Visit Summary for patient specific instructions.  No future appointments.  No orders of the defined types were placed in this encounter.     -------------------------------

## 2020-07-20 ENCOUNTER — Other Ambulatory Visit: Payer: Self-pay | Admitting: Adult Health

## 2020-07-20 ENCOUNTER — Telehealth: Payer: Self-pay | Admitting: Adult Health

## 2020-07-20 DIAGNOSIS — F909 Attention-deficit hyperactivity disorder, unspecified type: Secondary | ICD-10-CM

## 2020-07-20 MED ORDER — AMPHETAMINE-DEXTROAMPHET ER 30 MG PO CP24: 30.0000 mg | ORAL_CAPSULE | Freq: Two times a day (BID) | ORAL | 0 refills | Status: DC

## 2020-07-20 NOTE — Telephone Encounter (Signed)
Script sent  

## 2020-07-20 NOTE — Telephone Encounter (Signed)
Pt's husband called and made an appt for 08/16/2020. She needs a refill on her adderall xr 30 mg [facility] [place]

## 2020-08-16 ENCOUNTER — Ambulatory Visit: Payer: BC Managed Care – PPO | Admitting: Adult Health

## 2020-08-16 NOTE — Progress Notes (Deleted)
Desera Graffeo [id number] 08-20-1990 30 y.o.  Subjective:   Patient ID:  Meagan Austin is a 30 y.o. (DOB Jul 04, 1991) female.  Chief Complaint: No chief complaint on file.   HPI Zaydah Nawabi presents to the office today for follow-up of MDD, GAD, insomnia, and ADHD.   Referred by therapist - Margarita Grizzle in [place].  Describes mood today as "ok". Pleasant. Denies tearfulness. Mood symptoms - reports decreased anxiety, depression, and irritability. Denies panic attacks. Stating "I've been doing really good". Feels like mood has "leveled" out. Adderall is working well at $Remov'30mg'Kfkgoh$  BID. Diagnosed with Covid recently. Hospitalized over the weekend. Now recovering at home. Stable interest and motivation. Taking medications as prescribed.  Energy levels low. Active, does not have a regular exercise routine.  Enjoys some usual interests and activities. Married. Lives with husband of 56 years and 20 y/o daughter and a 14 y/o son. Parents local. Spending time with family.  Appetite adequate. Weight stable.  Sleeps well most nights. Averages 8 hours.  Focus and concentration stable. Completing tasks. Managing aspects of household. Work going well Games developer.    Denies SI or HI. Denies AH or VH.         Review of Systems:  Review of Systems  Medications: {medication reviewed/display:3041432}  Current Outpatient Medications  Medication Sig Dispense Refill  . Alfalfa 500 MG TABS Take 4 tablets (2,000 mg total) by mouth daily. Start with one tablet and increase by one every 1-2 days  0  . amphetamine-dextroamphetamine (ADDERALL XR) 30 MG 24 hr capsule Take 1 capsule (30 mg total) by mouth 2 (two) times daily. 60 capsule 0  . amphetamine-dextroamphetamine (ADDERALL XR) 30 MG 24 hr capsule Take 1 capsule (30 mg total) by mouth 2 (two) times daily. 60 capsule 0  . amphetamine-dextroamphetamine (ADDERALL XR) 30 MG 24 hr capsule Take 1 capsule (30 mg total) by mouth 2 (two) times daily. 60 capsule 0  .  amphetamine-dextroamphetamine (ADDERALL) 20 MG tablet Take 1 tablet (20 mg total) by mouth daily. 30 tablet 0  . benzocaine-Menthol (DERMOPLAST) 20-0.5 % AERO Apply 1 application topically as needed for irritation (perineal discomfort).    Marland Kitchen buPROPion (WELLBUTRIN XL) 150 MG 24 hr tablet Take 1 tablet (150 mg total) by mouth daily. 90 tablet 0  . coconut oil OIL Apply 1 application topically as needed.  0  . magnesium oxide (MAG-OX) 400 (241.3 Mg) MG tablet Take 400 mg by mouth daily.    . Prenatal MV-Min-Fe Fum-FA-DHA [facility] PRENATAL EARLY SHIELD) 27-0.8 & 200 MG MISC Take 2 tablets by mouth daily.    . sertraline (ZOLOFT) 100 MG tablet Take 1 tablet (100 mg total) by mouth daily. 90 tablet 2   No current facility-administered medications for this visit.    Medication Side Effects: {Medication Side Effects (Optional):21014029}  Allergies:  Allergies  Allergen Reactions  . Food Rash and Other (See Comments)    Allergy:  Pears   . Latex Rash    Past Medical History:  Diagnosis Date  . Bell's palsy    with first pregnancy  . Irritable bowel syndrome (IBS) 06-29-11   Bilat ear infections, finished  Amoxicillin last weekend (06-24-11)  . Neuromuscular disorder ([place])    R side Bell's Palsy  . No pertinent past medical history   . Preterm labor     No family history on file.  Social History   Socioeconomic History  . Marital status: Married    Spouse name: Not on file  .  Number of children: Not on file  . Years of education: Not on file  . Highest education level: Not on file  Occupational History  . Not on file  Tobacco Use  . Smoking status: Never Smoker  . Smokeless tobacco: Never Used  Substance and Sexual Activity  . Alcohol use: No  . Drug use: No  . Sexual activity: Yes    Birth control/protection: None  Other Topics Concern  . Not on file  Social History Narrative  . Not on file   Social Determinants of Health   Financial Resource Strain: Not on file   Food Insecurity: Not on file  Transportation Needs: Not on file  Physical Activity: Not on file  Stress: Not on file  Social Connections: Not on file  Intimate Partner Violence: Not on file    Past Medical History, Surgical history, Social history, and Family history were reviewed and updated as appropriate.   Please see review of systems for further details on the patient's review from today.   Objective:   Physical Exam:  There were no vitals taken for this visit.  Physical Exam  Lab Review:     Component Value Date/Time   NA 135 12/06/2010 1222   K 3.9 12/06/2010 1222   CL 99 12/06/2010 1222   CO2 22 12/06/2010 1222   GLUCOSE 80 12/06/2010 1222   BUN 8 12/06/2010 1222   CREATININE 0.55 12/06/2010 1222   CALCIUM 9.7 12/06/2010 1222   PROT 7.2 12/06/2010 1222   ALBUMIN 3.6 12/06/2010 1222   AST 18 12/06/2010 1222   ALT 14 12/06/2010 1222   ALKPHOS 67 12/06/2010 1222   BILITOT 0.3 12/06/2010 1222   GFRNONAA >60 12/06/2010 1222   GFRAA  12/06/2010 1222    >60        The eGFR has been calculated using the MDRD equation. This calculation has not been validated in all clinical situations. eGFR's persistently <60 mL/min signify possible Chronic Kidney Disease.       Component Value Date/Time   WBC 7.5 06/23/2016 0530   RBC 2.95 (L) 06/23/2016 0530   HGB 9.4 (L) 06/23/2016 0530   HCT 27.1 (L) 06/23/2016 0530   PLT 164 06/23/2016 0530   MCV 91.9 06/23/2016 0530   MCH 31.9 06/23/2016 0530   MCHC 34.7 06/23/2016 0530   RDW 15.9 (H) 06/23/2016 0530    No results found for: POCLITH, LITHIUM   No results found for: PHENYTOIN, PHENOBARB, VALPROATE, CBMZ   .res Assessment: Plan:    Plan:  1. Zoloft 197m daily 2. Adderall XR 318mevery morning to BID 3. Wellbutrin XL 15073mvery morning - denies seizures  Continue therapy  RTC 3 months  Patient advised to contact office with any questions, adverse effects, or acute worsening in signs and  symptoms.  Discussed potential benefits, risks, and side effects of stimulants with patient to include increased heart rate, palpitations, insomnia, increased anxiety, increased irritability, or decreased appetite.  Instructed patient to contact office if experiencing any significant tolerability issues.     Diagnoses and all orders for this visit:  Attention deficit hyperactivity disorder (ADHD), unspecified ADHD type  Insomnia, unspecified type  Major depressive disorder, recurrent episode, moderate ([place]pletonGeneralized anxiety disorder     Please see After Visit Summary for patient specific instructions.  No future appointments.  No orders of the defined types were placed in this encounter.   -------------------------------

## 2020-08-19 ENCOUNTER — Telehealth: Payer: Self-pay | Admitting: Adult Health

## 2020-08-19 ENCOUNTER — Other Ambulatory Visit: Payer: Self-pay | Admitting: Adult Health

## 2020-08-19 DIAGNOSIS — F909 Attention-deficit hyperactivity disorder, unspecified type: Secondary | ICD-10-CM

## 2020-08-19 MED ORDER — AMPHETAMINE-DEXTROAMPHET ER 30 MG PO CP24: 30.0000 mg | ORAL_CAPSULE | Freq: Two times a day (BID) | ORAL | 0 refills | Status: DC

## 2020-08-19 NOTE — Telephone Encounter (Signed)
Next appt is 08/26/20. Requesting refill on [facility] to [facility] in [place], . Phone number is [phone].

## 2020-08-19 NOTE — Telephone Encounter (Signed)
Script sent  

## 2020-08-26 ENCOUNTER — Telehealth (INDEPENDENT_AMBULATORY_CARE_PROVIDER_SITE_OTHER): Payer: BC Managed Care – PPO | Admitting: Adult Health

## 2020-08-26 ENCOUNTER — Encounter: Payer: Self-pay | Admitting: Adult Health

## 2020-08-26 DIAGNOSIS — F909 Attention-deficit hyperactivity disorder, unspecified type: Secondary | ICD-10-CM

## 2020-08-26 DIAGNOSIS — G4700 Insomnia, unspecified: Secondary | ICD-10-CM | POA: Diagnosis not present

## 2020-08-26 DIAGNOSIS — F411 Generalized anxiety disorder: Secondary | ICD-10-CM

## 2020-08-26 DIAGNOSIS — F331 Major depressive disorder, recurrent, moderate: Secondary | ICD-10-CM

## 2020-08-26 MED ORDER — AMPHETAMINE-DEXTROAMPHET ER 30 MG PO CP24: 30.0000 mg | ORAL_CAPSULE | Freq: Two times a day (BID) | ORAL | 0 refills | Status: DC

## 2020-08-26 MED ORDER — BUPROPION HCL ER (XL) 150 MG PO TB24: 150.0000 mg | ORAL_TABLET | Freq: Every day | ORAL | 0 refills | Status: DC

## 2020-08-26 MED ORDER — SERTRALINE HCL 100 MG PO TABS: 100.0000 mg | ORAL_TABLET | Freq: Every day | ORAL | 2 refills | Status: DC

## 2020-08-26 NOTE — Progress Notes (Signed)
Meagan Austin [id number] 1991/02/12 30 y.o.  Virtual Visit via Video Note  I connected with pt @ on 08/26/20 at  9:00 AM EST by a video enabled telemedicine application and verified that I am speaking with the correct person using two identifiers.   I discussed the limitations of evaluation and management by telemedicine and the availability of in person appointments. The patient expressed understanding and agreed to proceed.  I discussed the assessment and treatment plan with the patient. The patient was provided an opportunity to ask questions and all were answered. The patient agreed with the plan and demonstrated an understanding of the instructions.   The patient was advised to call back or seek an in-person evaluation if the symptoms worsen or if the condition fails to improve as anticipated.  I provided 30 minutes of non-face-to-face time during this encounter.  The patient was located at home.  The provider was located at [place].   [name], NP   Subjective:   Patient ID:  Meagan Austin is a 30 y.o. (DOB [birth year]) female.  Chief Complaint: No chief complaint on file.   HPI Meagan Austin presents for follow-up of MDD, GAD, insomnia, and ADHD.   Referred by therapist - Margarita Grizzle in [place].  Describes mood today as "ok". Pleasant. Denies tearfulness. Mood symptoms - reports decreased anxiety, depression, and irritability. Reports a "few" panic attacks recently. Stating "I'm good, everything seems to be going well". Stating "I feel better". Working with therapist. Stable interest and motivation. Taking medications as prescribed.  Energy levels lower with lack of sleep. Active, does not have a regular exercise routine.  Enjoys some usual interests and activities. Married. Lives with husband of 23 years and 74 y/o daughter and a 50 y/o son. Parents local. Spending time with family.  Appetite adequate. Weight stable.  Sleeps well most nights. Averages 4  hours. Focus and concentration stable. Completing tasks. Managing aspects of household. 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 Denies SI or HI.  Denies AH or VH.    Review of Systems:  Review of Systems  Musculoskeletal: Negative for gait problem.  Neurological: Negative for tremors.  Psychiatric/Behavioral:       Please refer to HPI    Medications: I have reviewed the patient's current medications.  Current Outpatient Medications  Medication Sig Dispense Refill  . Alfalfa 500 MG TABS Take 4 tablets (2,000 mg total) by mouth daily. Start with one tablet and increase by one every 1-2 days  0  . amphetamine-dextroamphetamine (ADDERALL XR) 30 MG 24 hr capsule Take 1 capsule (30 mg total) by mouth 2 (two) times daily. 60 capsule 0  . [START ON 09/23/2020] amphetamine-dextroamphetamine (ADDERALL XR) 30 MG 24 hr capsule Take 1 capsule (30 mg total) by mouth 2 (two) times daily. 60 capsule 0  . [START ON 10/21/2020] amphetamine-dextroamphetamine (ADDERALL XR) 30 MG 24 hr capsule Take 1 capsule (30 mg total) by mouth 2 (two) times daily. 60 capsule 0  . benzocaine-Menthol (DERMOPLAST) 20-0.5 % AERO Apply 1 application topically as needed for irritation (perineal discomfort).    Marland Kitchen buPROPion (WELLBUTRIN XL) 150 MG 24 hr tablet Take 1 tablet (150 mg total) by mouth daily. 90 tablet 0  . coconut oil OIL Apply 1 application topically as needed.  0  . magnesium oxide (MAG-OX) 400 (241.3 Mg) MG tablet Take 400 mg by mouth daily.    . Prenatal MV-Min-Fe Fum-FA-DHA [facility] PRENATAL EARLY SHIELD) 27-0.8 & 200 MG MISC Take  2 tablets by mouth daily.    . sertraline (ZOLOFT) 100 MG tablet Take 1 tablet (100 mg total) by mouth daily. 90 tablet 2   No current facility-administered medications for this visit.    Medication Side Effects: None  Allergies:  Allergies  Allergen Reactions  . Food Rash and Other (See Comments)    Allergy:  Pears   . Latex Rash    Past Medical  History:  Diagnosis Date  . Bell's palsy    with first pregnancy  . Irritable bowel syndrome (IBS) 06-29-11   Bilat ear infections, finished  Amoxicillin last weekend (06-24-11)  . 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 No pertinent past medical history   . Preterm labor     No family history on file.  Social History   Socioeconomic History  . Marital status: Married    Spouse name: Not on file  . Number of children: Not on file  . Years of education: Not on file  . Highest education level: Not on file  Occupational History  . Not on file  Tobacco Use  . Smoking status: Never Smoker  . Smokeless tobacco: Never Used  Substance and Sexual Activity  . Alcohol use: No  . Drug use: No  . Sexual activity: Yes    Birth control/protection: None  Other Topics Concern  . Not on file  Social History Narrative  . Not on file   Social Determinants of Health   Financial Resource Strain: Not on file  Food Insecurity: Not on file  Transportation Needs: Not on file  Physical Activity: Not on file  Stress: Not on file  Social Connections: Not on file  Intimate Partner Violence: Not on file    Past Medical History, Surgical history, Social history, and Family history were reviewed and updated as appropriate.   Please see review of systems for further details on the patient's review from today.   Objective:   Physical Exam:  There were no vitals taken for this visit.  Physical Exam Constitutional:      General: She is not in acute distress. Musculoskeletal:        General: No deformity.  Neurological:     Mental Status: She is alert and oriented to person, place, and time.     Coordination: Coordination normal.  Psychiatric:        Attention and Perception: Attention and perception normal. She does not perceive auditory or visual hallucinations.        Mood and Affect: Mood normal. Mood is not anxious or depressed. Affect is not labile, blunt, angry or  inappropriate.        Speech: Speech normal.        Behavior: Behavior normal.        Thought Content: Thought content normal. Thought content is not paranoid or delusional. Thought content does not include homicidal or suicidal ideation. Thought content does not include homicidal or suicidal plan.        Cognition and Memory: Cognition and memory normal.        Judgment: Judgment normal.     Comments: Insight intact     Lab Review:     Component Value Date/Time   NA 135 12/06/2010 1222   K 3.9 12/06/2010 1222   CL 99 12/06/2010 1222   CO2 22 12/06/2010 1222   GLUCOSE 80 12/06/2010 1222   BUN 8 12/06/2010 1222   CREATININE 0.55 12/06/2010 1222   CALCIUM  9.7 12/06/2010 1222   PROT 7.2 12/06/2010 1222   ALBUMIN 3.6 12/06/2010 1222   AST 18 12/06/2010 1222   ALT 14 12/06/2010 1222   ALKPHOS 67 12/06/2010 1222   BILITOT 0.3 12/06/2010 1222   GFRNONAA >60 12/06/2010 1222   GFRAA  12/06/2010 1222    >60        The eGFR has been calculated using the MDRD equation. This calculation has not been validated in all clinical situations. eGFR's persistently <60 mL/min signify possible Chronic Kidney Disease.       Component Value Date/Time   WBC 7.5 06/23/2016 0530   RBC 2.95 (L) 06/23/2016 0530   HGB 9.4 (L) 06/23/2016 0530   HCT 27.1 (L) 06/23/2016 0530   PLT 164 06/23/2016 0530   MCV 91.9 06/23/2016 0530   MCH 31.9 06/23/2016 0530   MCHC 34.7 06/23/2016 0530   RDW 15.9 (H) 06/23/2016 0530    No results found for: POCLITH, LITHIUM   No results found for: PHENYTOIN, PHENOBARB, VALPROATE, CBMZ   .res Assessment: Plan:    Plan:  1. Zoloft 123m daily 2. Adderall XR 352mevery morning to BID 3. Wellbutrin XL 15041mvery morning - denies seizures   Continue therapy  RTC 3 months  Patient advised to contact office with any questions, adverse effects, or acute worsening in signs and symptoms.  Discussed potential benefits, risks, and side effects of stimulants  with patient to include increased heart rate, palpitations, insomnia, increased anxiety, increased irritability, or decreased appetite.  Instructed patient to contact office if experiencing any significant tolerability issues.    Diagnoses and all orders for this visit:  Attention deficit hyperactivity disorder (ADHD), unspecified ADHD type -     amphetamine-dextroamphetamine (ADDERALL XR) 30 MG 24 hr capsule; Take 1 capsule (30 mg total) by mouth 2 (two) times daily. -     amphetamine-dextroamphetamine (ADDERALL XR) 30 MG 24 hr capsule; Take 1 capsule (30 mg total) by mouth 2 (two) times daily. -     amphetamine-dextroamphetamine (ADDERALL XR) 30 MG 24 hr capsule; Take 1 capsule (30 mg total) by mouth 2 (two) times daily.  Insomnia, unspecified type -     sertraline (ZOLOFT) 100 MG tablet; Take 1 tablet (100 mg total) by mouth daily.  Major depressive disorder, recurrent episode, moderate (HCC) -     sertraline (ZOLOFT) 100 MG tablet; Take 1 tablet (100 mg total) by mouth daily. -     buPROPion (WELLBUTRIN XL) 150 MG 24 hr tablet; Take 1 tablet (150 mg total) by mouth daily.  Generalized anxiety disorder -     sertraline (ZOLOFT) 100 MG tablet; Take 1 tablet (100 mg total) by mouth daily. -     buPROPion (WELLBUTRIN XL) 150 MG 24 hr tablet; Take 1 tablet (150 mg total) by mouth daily.     Please see After Visit Summary for patient specific instructions.  No future appointments.  No orders of the defined types were placed in this encounter.     -------------------------------

## 2020-12-03 ENCOUNTER — Telehealth: Payer: Self-pay | Admitting: Adult Health

## 2020-12-03 ENCOUNTER — Other Ambulatory Visit: Payer: Self-pay

## 2020-12-03 DIAGNOSIS — F909 Attention-deficit hyperactivity disorder, unspecified type: Secondary | ICD-10-CM

## 2020-12-03 NOTE — Telephone Encounter (Signed)
pended

## 2020-12-03 NOTE — Telephone Encounter (Signed)
Next visit is 12/13/20. Requesting a refill on Adderall XR 30 mg called to:  [id number] [place], [place] - [address] Phone:  [phone]  Fax:  [phone]

## 2020-12-07 MED ORDER — AMPHETAMINE-DEXTROAMPHET ER 30 MG PO CP24: 30.0000 mg | ORAL_CAPSULE | Freq: Two times a day (BID) | ORAL | 0 refills | Status: DC

## 2020-12-13 ENCOUNTER — Ambulatory Visit: Payer: BC Managed Care – PPO | Admitting: Adult Health

## 2021-01-11 ENCOUNTER — Telehealth: Payer: Self-pay | Admitting: Adult Health

## 2021-01-11 NOTE — Telephone Encounter (Signed)
Pt called requesting refill for Adderall @ [facility] [place].    Apt 7/6

## 2021-01-12 ENCOUNTER — Other Ambulatory Visit: Payer: Self-pay

## 2021-01-12 ENCOUNTER — Telehealth: Payer: BC Managed Care – PPO | Admitting: Adult Health

## 2021-01-12 DIAGNOSIS — F489 Nonpsychotic mental disorder, unspecified: Secondary | ICD-10-CM

## 2021-01-12 DIAGNOSIS — F909 Attention-deficit hyperactivity disorder, unspecified type: Secondary | ICD-10-CM

## 2021-01-12 MED ORDER — AMPHETAMINE-DEXTROAMPHET ER 30 MG PO CP24: 30.0000 mg | ORAL_CAPSULE | Freq: Two times a day (BID) | ORAL | 0 refills | Status: DC

## 2021-01-12 MED ORDER — SERTRALINE HCL 100 MG PO TABS: 100.0000 mg | ORAL_TABLET | Freq: Every day | ORAL | 2 refills | Status: DC

## 2021-01-12 MED ORDER — BUPROPION HCL ER (XL) 150 MG PO TB24: 150.0000 mg | ORAL_TABLET | Freq: Every day | ORAL | 2 refills | Status: DC

## 2021-01-12 NOTE — Telephone Encounter (Signed)
Pended.

## 2021-01-12 NOTE — Progress Notes (Signed)
Could not connect with patient by phone or video. 

## 2021-01-12 NOTE — Progress Notes (Signed)
Could not connect with patient by phone or video.

## 2021-02-15 ENCOUNTER — Telehealth: Payer: Self-pay | Admitting: Adult Health

## 2021-02-28 NOTE — Telephone Encounter (Signed)
Error

## 2021-04-25 ENCOUNTER — Telehealth: Payer: Self-pay | Admitting: Adult Health

## 2021-04-25 NOTE — Telephone Encounter (Signed)
Last filled 9/16 will hold off on pending until appt is scheduled

## 2021-04-25 NOTE — Telephone Encounter (Signed)
Please schedule appt

## 2021-04-25 NOTE — Telephone Encounter (Signed)
Please refill Adderall XR 30mg  [facility] [address] [facility] [place].

## 2021-04-26 ENCOUNTER — Other Ambulatory Visit: Payer: Self-pay

## 2021-04-26 DIAGNOSIS — F909 Attention-deficit hyperactivity disorder, unspecified type: Secondary | ICD-10-CM

## 2021-04-26 MED ORDER — AMPHETAMINE-DEXTROAMPHET ER 30 MG PO CP24: 30.0000 mg | ORAL_CAPSULE | Freq: Two times a day (BID) | ORAL | 0 refills | Status: DC

## 2021-04-26 NOTE — Telephone Encounter (Signed)
This pt has not been seen since 08/26/20.She had a video visit in July she did not show up to and never rescheduled.Do you want to send with no appt scheduled?

## 2021-04-26 NOTE — Telephone Encounter (Signed)
We can go ahead and send it in.

## 2021-04-26 NOTE — Telephone Encounter (Signed)
Pended.

## 2021-06-01 ENCOUNTER — Other Ambulatory Visit: Payer: Self-pay

## 2021-06-01 ENCOUNTER — Telehealth: Payer: Self-pay | Admitting: Adult Health

## 2021-06-01 DIAGNOSIS — F909 Attention-deficit hyperactivity disorder, unspecified type: Secondary | ICD-10-CM

## 2021-06-01 NOTE — Telephone Encounter (Signed)
Patient called in for refill on Adderall XR 30mg . She asked if there is any way this could be done today. Ph: [phone]. [facility] [id number] [place]

## 2021-06-01 NOTE — Telephone Encounter (Signed)
No voicemail set up 

## 2021-06-01 NOTE — Telephone Encounter (Signed)
Pended please schedule appt

## 2021-06-06 NOTE — Telephone Encounter (Signed)
Please schedule appt

## 2021-06-06 NOTE — Telephone Encounter (Signed)
Patient needs an appointment

## 2021-06-07 MED ORDER — AMPHETAMINE-DEXTROAMPHET ER 30 MG PO CP24: 30.0000 mg | ORAL_CAPSULE | Freq: Two times a day (BID) | ORAL | 0 refills | Status: DC

## 2021-06-07 NOTE — Telephone Encounter (Signed)
Meagan Austin called to request refill of her Adderall. She made appt. For 07/07/21. She said her VM was set up, though she did say someone else told her it wasn't working.  Anyway, she has now been out of her medication and needs it filled.  Her appt. 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  [facility] [address] [facility] [place]

## 2021-07-07 ENCOUNTER — Encounter: Payer: Self-pay | Admitting: Adult Health

## 2021-07-07 ENCOUNTER — Other Ambulatory Visit: Payer: Self-pay

## 2021-07-07 ENCOUNTER — Ambulatory Visit (INDEPENDENT_AMBULATORY_CARE_PROVIDER_SITE_OTHER): Payer: BC Managed Care – PPO | Admitting: Adult Health

## 2021-07-07 DIAGNOSIS — F331 Major depressive disorder, recurrent, moderate: Secondary | ICD-10-CM | POA: Diagnosis not present

## 2021-07-07 DIAGNOSIS — G4700 Insomnia, unspecified: Secondary | ICD-10-CM

## 2021-07-07 DIAGNOSIS — F909 Attention-deficit hyperactivity disorder, unspecified type: Secondary | ICD-10-CM | POA: Diagnosis not present

## 2021-07-07 DIAGNOSIS — F411 Generalized anxiety disorder: Secondary | ICD-10-CM | POA: Diagnosis not present

## 2021-07-07 MED ORDER — SERTRALINE HCL 100 MG PO TABS
100.0000 mg | ORAL_TABLET | Freq: Every day | ORAL | 3 refills | Status: DC
Start: 2021-07-07 — End: 2022-05-04

## 2021-07-07 MED ORDER — AMPHETAMINE-DEXTROAMPHET ER 30 MG PO CP24: 30.0000 mg | ORAL_CAPSULE | Freq: Two times a day (BID) | ORAL | 0 refills | Status: DC

## 2021-07-07 MED ORDER — BUPROPION HCL ER (XL) 150 MG PO TB24: ORAL_TABLET | ORAL | 5 refills | Status: DC

## 2021-07-07 NOTE — Progress Notes (Signed)
Meagan Austin [id number] 1991/03/18 30 y.o.  Subjective:   Patient ID:  Meagan Austin is a 30 y.o. (DOB [birth year]) female.  Chief Complaint: No chief complaint on file.   HPI Meagan Austin presents to the office today for follow-up of MDD, GAD, insomnia, and ADHD.   Referred by therapist - Margarita Grizzle in [place].  Describes mood today as "lower". Pleasant. Denies tearfulness. Mood symptoms - reports depression, anxiety and irritability. Denies recent panic attacks recently. Stating "I'm not doing as good". Having difficulties with the motivation to get things done. Working with therapist. Decreased interest. Taking medications as prescribed.  Energy levels lower. Active, does not have a regular exercise routine.  Enjoys some usual interests and activities. Married. Lives with husband of 78 years and 76 y/o daughter and a 42 y/o son. Parents local. Spending time with family.  Appetite adequate. Weight gain  Sleeps well most nights. Averages 8 hours. Focus and concentration stable. Completing tasks. Managing aspects of household. 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 Denies SI or HI.  Denies AH or VH.       Review of Systems:  Review of Systems  Musculoskeletal:  Negative for gait problem.  Neurological:  Negative for tremors.  Psychiatric/Behavioral:         Please refer to HPI   Medications: I have reviewed the patient's current medications.  Current Outpatient Medications  Medication Sig Dispense Refill   Alfalfa 500 MG TABS Take 4 tablets (2,000 mg total) by mouth daily. Start with one tablet and increase by one every 1-2 days  0   amphetamine-dextroamphetamine (ADDERALL XR) 30 MG 24 hr capsule Take 1 capsule (30 mg total) by mouth 2 (two) times daily. 60 capsule 0   [START ON 08/04/2021] amphetamine-dextroamphetamine (ADDERALL XR) 30 MG 24 hr capsule Take 1 capsule (30 mg total) by mouth 2 (two) times daily. 60 capsule 0   [START ON 09/01/2021]  amphetamine-dextroamphetamine (ADDERALL XR) 30 MG 24 hr capsule Take 1 capsule (30 mg total) by mouth 2 (two) times daily. 60 capsule 0   benzocaine-Menthol (DERMOPLAST) 20-0.5 % AERO Apply 1 application topically as needed for irritation (perineal discomfort).     buPROPion (WELLBUTRIN XL) 150 MG 24 hr tablet Take one tablet every morning for 7 days, then increase to two tablets every morning. 60 tablet 5   coconut oil OIL Apply 1 application topically as needed.  0   magnesium oxide (MAG-OX) 400 (241.3 Mg) MG tablet Take 400 mg by mouth daily.     Prenatal MV-Min-Fe Fum-FA-DHA [facility] PRENATAL EARLY SHIELD) 27-0.8 & 200 MG MISC Take 2 tablets by mouth daily.     sertraline (ZOLOFT) 100 MG tablet Take 1 tablet (100 mg total) by mouth daily. 90 tablet 3   No current facility-administered medications for this visit.    Medication Side Effects: None  Allergies:  Allergies  Allergen Reactions   Food Rash and Other (See Comments)    Allergy:  Pears    Latex Rash    Past Medical History:  Diagnosis Date   Bell's palsy    with first pregnancy   Irritable bowel syndrome (IBS) 06-29-11   Bilat ear infections, finished  Amoxicillin last weekend (06-24-11)   Neuromuscular disorder ([place])    R side Bell's Palsy   No pertinent past medical history    Preterm labor     Past Medical History, Surgical history, Social history, and Family history were reviewed and updated as appropriate.   Please see  review of systems for further details on the patient's review from today.   Objective:   Physical Exam:  There were no vitals taken for this visit.  Physical Exam Constitutional:      General: She is not in acute distress. Musculoskeletal:        General: No deformity.  Neurological:     Mental Status: She is alert and oriented to person, place, and time.     Coordination: Coordination normal.  Psychiatric:        Attention and Perception: Attention and perception normal. She does not  perceive auditory or visual hallucinations.        Mood and Affect: Mood normal. Mood is not anxious or depressed. Affect is not labile, blunt, angry or inappropriate.        Speech: Speech normal.        Behavior: Behavior normal.        Thought Content: Thought content normal. Thought content is not paranoid or delusional. Thought content does not include homicidal or suicidal ideation. Thought content does not include homicidal or suicidal plan.        Cognition and Memory: Cognition and memory normal.        Judgment: Judgment normal.     Comments: Insight intact    Lab Review:     Component Value Date/Time   NA 135 12/06/2010 1222   K 3.9 12/06/2010 1222   CL 99 12/06/2010 1222   CO2 22 12/06/2010 1222   GLUCOSE 80 12/06/2010 1222   BUN 8 12/06/2010 1222   CREATININE 0.55 12/06/2010 1222   CALCIUM 9.7 12/06/2010 1222   PROT 7.2 12/06/2010 1222   ALBUMIN 3.6 12/06/2010 1222   AST 18 12/06/2010 1222   ALT 14 12/06/2010 1222   ALKPHOS 67 12/06/2010 1222   BILITOT 0.3 12/06/2010 1222   GFRNONAA >60 12/06/2010 1222   GFRAA  12/06/2010 1222    >60        The eGFR has been calculated using the MDRD equation. This calculation has not been validated in all clinical situations. eGFR's persistently <60 mL/min signify possible Chronic Kidney Disease.       Component Value Date/Time   WBC 7.5 06/23/2016 0530   RBC 2.95 (L) 06/23/2016 0530   HGB 9.4 (L) 06/23/2016 0530   HCT 27.1 (L) 06/23/2016 0530   PLT 164 06/23/2016 0530   MCV 91.9 06/23/2016 0530   MCH 31.9 06/23/2016 0530   MCHC 34.7 06/23/2016 0530   RDW 15.9 (H) 06/23/2016 0530    No results found for: POCLITH, LITHIUM   No results found for: PHENYTOIN, PHENOBARB, VALPROATE, CBMZ   .res Assessment: Plan:    Plan:  1. Zoloft 155m daily 2. Adderall XR 348mevery morning to BID 3. Restart Wellbutrin XL 15070mvery morning x 7 days, then increase to 300m46mily - denies seizure history  Continue  therapy  RTC 4 weeks  Patient advised to contact office with any questions, adverse effects, or acute worsening in signs and symptoms.  Discussed potential benefits, risks, and side effects of stimulants with patient to include increased heart rate, palpitations, insomnia, increased anxiety, increased irritability, or decreased appetite. Instructed patient to contact office if experiencing any significant tolerability issues.  Diagnoses and all orders for this visit:  Attention deficit hyperactivity disorder (ADHD), unspecified ADHD type -     amphetamine-dextroamphetamine (ADDERALL XR) 30 MG 24 hr capsule; Take 1 capsule (30 mg total) by mouth 2 (two) times daily. -  amphetamine-dextroamphetamine (ADDERALL XR) 30 MG 24 hr capsule; Take 1 capsule (30 mg total) by mouth 2 (two) times daily. -     amphetamine-dextroamphetamine (ADDERALL XR) 30 MG 24 hr capsule; Take 1 capsule (30 mg total) by mouth 2 (two) times daily.  Major depressive disorder, recurrent episode, moderate (HCC) -     buPROPion (WELLBUTRIN XL) 150 MG 24 hr tablet; Take one tablet every morning for 7 days, then increase to two tablets every morning. -     sertraline (ZOLOFT) 100 MG tablet; Take 1 tablet (100 mg total) by mouth daily.  Generalized anxiety disorder -     buPROPion (WELLBUTRIN XL) 150 MG 24 hr tablet; Take one tablet every morning for 7 days, then increase to two tablets every morning. -     sertraline (ZOLOFT) 100 MG tablet; Take 1 tablet (100 mg total) by mouth daily.  Insomnia, unspecified type -     sertraline (ZOLOFT) 100 MG tablet; Take 1 tablet (100 mg total) by mouth daily.     Please see After Visit Summary for patient specific instructions.  Future Appointments  Date Time Provider [place]  08/04/2021  5:20 PM Ash Mcelwain, Berdie Ogren, NP CP-CP None    No orders of the defined types were placed in this encounter.   -------------------------------

## 2021-08-02 ENCOUNTER — Other Ambulatory Visit: Payer: Self-pay | Admitting: Adult Health

## 2021-08-02 DIAGNOSIS — F411 Generalized anxiety disorder: Secondary | ICD-10-CM

## 2021-08-02 DIAGNOSIS — F331 Major depressive disorder, recurrent, moderate: Secondary | ICD-10-CM

## 2021-08-04 ENCOUNTER — Ambulatory Visit: Payer: BC Managed Care – PPO | Admitting: Adult Health

## 2021-08-04 NOTE — Progress Notes (Signed)
Patient no show appointment. ? ?

## 2021-08-15 ENCOUNTER — Other Ambulatory Visit: Payer: Self-pay

## 2021-08-15 ENCOUNTER — Telehealth: Payer: Self-pay | Admitting: Adult Health

## 2021-08-15 DIAGNOSIS — F909 Attention-deficit hyperactivity disorder, unspecified type: Secondary | ICD-10-CM

## 2021-08-15 MED ORDER — AMPHETAMINE-DEXTROAMPHET ER 30 MG PO CP24: 30.0000 mg | ORAL_CAPSULE | Freq: Two times a day (BID) | ORAL | 0 refills | Status: DC

## 2021-08-15 NOTE — Telephone Encounter (Signed)
Pt requested refill of Adderall to [facility] in [place].  It is available there.   No upcoming appt scheduled

## 2021-08-15 NOTE — Telephone Encounter (Signed)
Pended.

## 2021-08-16 ENCOUNTER — Other Ambulatory Visit: Payer: Self-pay

## 2021-08-16 MED ORDER — ADZENYS XR-ODT 18.8 MG PO TBED: 18.8000 mg | EXTENDED_RELEASE_TABLET | Freq: Every day | ORAL | 0 refills | Status: DC

## 2021-08-16 NOTE — Telephone Encounter (Signed)
Pt would like to try adzenys,I gave her the info

## 2021-08-16 NOTE — Telephone Encounter (Signed)
Pended.

## 2021-08-16 NOTE — Telephone Encounter (Signed)
Ok to pend.

## 2021-08-16 NOTE — Telephone Encounter (Signed)
Pt called and said that her pharmacy only has brand only adderall. This will cost her 400 dollars. She has called other pharmacies and all of them have said no they don't have any adderall in stock. So she would like to know if gina is willing to switch her to something else that is compatable to adderall. She was going to continue calling to find the adderall. Please call her and let her know what gina has decided at [phone]

## 2021-08-18 ENCOUNTER — Telehealth: Payer: Self-pay | Admitting: Adult Health

## 2021-08-18 NOTE — Telephone Encounter (Signed)
Pt informed

## 2021-08-18 NOTE — Telephone Encounter (Signed)
Meagan Austin called because she did up the new medication, Adzenys.  She has some questions about this medication.  Please call to discuss.

## 2021-08-18 NOTE — Telephone Encounter (Signed)
Pt stated she was taking Adderall 30 mg BID.She wants to know since 1 adzenys tab is equal to 30 mg of adderall,should she be taking 2?

## 2021-08-18 NOTE — Telephone Encounter (Signed)
Continue with current dose for now. It is a different medication. Also not sure if we can get 2 covered.

## 2021-09-07 ENCOUNTER — Other Ambulatory Visit: Payer: Self-pay | Admitting: Adult Health

## 2021-09-07 DIAGNOSIS — F331 Major depressive disorder, recurrent, moderate: Secondary | ICD-10-CM

## 2021-09-07 DIAGNOSIS — F411 Generalized anxiety disorder: Secondary | ICD-10-CM

## 2021-09-08 NOTE — Telephone Encounter (Signed)
Please call to schedule an appt. Last seen 12/29 with RTC in 4 weeks. Was a no show for F/U.  ?

## 2021-09-08 NOTE — Telephone Encounter (Signed)
Pt scheduled for 3-28

## 2021-09-22 ENCOUNTER — Other Ambulatory Visit: Payer: Self-pay | Admitting: Adult Health

## 2021-10-04 ENCOUNTER — Ambulatory Visit: Payer: BC Managed Care – PPO | Admitting: Adult Health

## 2021-10-06 ENCOUNTER — Ambulatory Visit (INDEPENDENT_AMBULATORY_CARE_PROVIDER_SITE_OTHER): Payer: Self-pay | Admitting: Adult Health

## 2021-10-06 DIAGNOSIS — F489 Nonpsychotic mental disorder, unspecified: Secondary | ICD-10-CM

## 2021-10-06 NOTE — Progress Notes (Signed)
Patient no show appointment. ? ?

## 2021-11-03 ENCOUNTER — Other Ambulatory Visit: Payer: Self-pay | Admitting: Adult Health

## 2021-11-03 ENCOUNTER — Telehealth: Payer: Self-pay | Admitting: Adult Health

## 2021-11-03 DIAGNOSIS — F909 Attention-deficit hyperactivity disorder, unspecified type: Secondary | ICD-10-CM

## 2021-11-03 MED ORDER — AMPHETAMINE-DEXTROAMPHET ER 30 MG PO CP24: 30.0000 mg | ORAL_CAPSULE | Freq: Two times a day (BID) | ORAL | 0 refills | Status: DC

## 2021-11-03 NOTE — Telephone Encounter (Signed)
Patient's husband called in for refill on Adderall XR 30mg . Ph: [phone] [facility] [address] [place]  ?

## 2021-11-03 NOTE — Telephone Encounter (Signed)
Script sent  

## 2021-11-28 ENCOUNTER — Other Ambulatory Visit: Payer: Self-pay | Admitting: Adult Health

## 2021-11-28 DIAGNOSIS — F331 Major depressive disorder, recurrent, moderate: Secondary | ICD-10-CM

## 2021-11-28 DIAGNOSIS — F411 Generalized anxiety disorder: Secondary | ICD-10-CM

## 2021-11-30 NOTE — Telephone Encounter (Signed)
Please call patient to schedule an appt.-

## 2021-12-01 NOTE — Telephone Encounter (Signed)
Left VM on husband's # per DPR, as we are unable to reach patient with the # that we have for her.

## 2021-12-01 NOTE — Telephone Encounter (Signed)
No voicemail set up. Can;t leave a message

## 2021-12-06 NOTE — Telephone Encounter (Signed)
Unable to reach patient with the phone # provided. Was able to reach husband today and let him know that patient needed to schedule an appt if she was going to continue to follow with [place]. He said he would let her know.

## 2021-12-12 ENCOUNTER — Other Ambulatory Visit: Payer: Self-pay | Admitting: Adult Health

## 2021-12-12 DIAGNOSIS — F331 Major depressive disorder, recurrent, moderate: Secondary | ICD-10-CM

## 2021-12-12 DIAGNOSIS — F411 Generalized anxiety disorder: Secondary | ICD-10-CM

## 2021-12-12 NOTE — Telephone Encounter (Signed)
Please schedule appt before we can send rx

## 2021-12-12 NOTE — Telephone Encounter (Signed)
Needs to schedule an appt and keep it - can send in enough medications until appt.

## 2021-12-12 NOTE — Telephone Encounter (Signed)
Last seen December 2022.Last 2 appt were a no show.Please advise if ok to send ,no appt scheduled

## 2021-12-12 NOTE — Telephone Encounter (Signed)
Called, UTR. No VM

## 2021-12-12 NOTE — Telephone Encounter (Signed)
Pt called requesting RF for Bupropion 150 mg 2/d and Generic Adderall XR 30 mg 2/d to [facility] [place], [place]  Last apt 3/30. Marked as NO SHOW. Came in office. See office notes. Could not fill last month due to finances. Stretched out last Rx as long as possible. Contact Pt if needed at [phone]

## 2021-12-12 NOTE — Telephone Encounter (Signed)
Noted. Ty!

## 2021-12-14 MED ORDER — ADZENYS XR-ODT 18.8 MG PO TBED: 18.8000 mg | EXTENDED_RELEASE_TABLET | Freq: Every day | ORAL | 0 refills | Status: DC

## 2021-12-14 MED ORDER — BUPROPION HCL ER (XL) 150 MG PO TB24: ORAL_TABLET | ORAL | 0 refills | Status: DC

## 2021-12-14 NOTE — Addendum Note (Signed)
Addended by: Lind Covert on: 12/14/2021 11:39 AM   Modules accepted: Orders

## 2021-12-14 NOTE — Telephone Encounter (Signed)
Apt 6/23. Please send RF.

## 2021-12-15 ENCOUNTER — Other Ambulatory Visit: Payer: Self-pay | Admitting: Adult Health

## 2021-12-15 DIAGNOSIS — F909 Attention-deficit hyperactivity disorder, unspecified type: Secondary | ICD-10-CM

## 2021-12-15 MED ORDER — AMPHETAMINE-DEXTROAMPHET ER 30 MG PO CP24: 30.0000 mg | ORAL_CAPSULE | Freq: Two times a day (BID) | ORAL | 0 refills | Status: DC

## 2021-12-15 NOTE — Telephone Encounter (Signed)
Kewanda called today at 11:50 regrading her Adderall refill.  You had prescribed Adzenys once before because she couldn't find the Adderall, however, the Adderall is available again and she would prefer to have the Adderall vs the Adzenys.  Please cancel the prescription you just sent for the [facility] and send in prescription for her Adderall.  Appt 12/30/21.  [facility] [place] [place]

## 2021-12-30 ENCOUNTER — Encounter: Payer: Self-pay | Admitting: Adult Health

## 2021-12-30 ENCOUNTER — Ambulatory Visit (INDEPENDENT_AMBULATORY_CARE_PROVIDER_SITE_OTHER): Payer: BC Managed Care – PPO | Admitting: Adult Health

## 2021-12-30 DIAGNOSIS — G4700 Insomnia, unspecified: Secondary | ICD-10-CM

## 2021-12-30 DIAGNOSIS — F331 Major depressive disorder, recurrent, moderate: Secondary | ICD-10-CM

## 2021-12-30 DIAGNOSIS — F909 Attention-deficit hyperactivity disorder, unspecified type: Secondary | ICD-10-CM | POA: Diagnosis not present

## 2021-12-30 DIAGNOSIS — F411 Generalized anxiety disorder: Secondary | ICD-10-CM | POA: Diagnosis not present

## 2021-12-30 MED ORDER — AMPHETAMINE-DEXTROAMPHET ER 30 MG PO CP24: 30.0000 mg | ORAL_CAPSULE | Freq: Two times a day (BID) | ORAL | 0 refills | Status: DC

## 2021-12-30 MED ORDER — BUPROPION HCL ER (XL) 300 MG PO TB24: ORAL_TABLET | ORAL | 5 refills | Status: DC

## 2021-12-30 MED ORDER — AMPHETAMINE-DEXTROAMPHET ER 30 MG PO CP24
30.0000 mg | ORAL_CAPSULE | Freq: Two times a day (BID) | ORAL | 0 refills | Status: DC
Start: 2021-12-30 — End: 2022-04-26

## 2021-12-30 MED ORDER — AMPHETAMINE-DEXTROAMPHET ER 30 MG PO CP24
30.0000 mg | ORAL_CAPSULE | Freq: Two times a day (BID) | ORAL | 0 refills | Status: DC
Start: 2022-02-24 — End: 2022-05-04

## 2022-01-12 ENCOUNTER — Other Ambulatory Visit: Payer: Self-pay | Admitting: Adult Health

## 2022-01-12 DIAGNOSIS — F331 Major depressive disorder, recurrent, moderate: Secondary | ICD-10-CM

## 2022-01-12 DIAGNOSIS — F411 Generalized anxiety disorder: Secondary | ICD-10-CM

## 2022-01-13 ENCOUNTER — Other Ambulatory Visit: Payer: Self-pay | Admitting: Adult Health

## 2022-01-13 DIAGNOSIS — F411 Generalized anxiety disorder: Secondary | ICD-10-CM

## 2022-01-13 DIAGNOSIS — F331 Major depressive disorder, recurrent, moderate: Secondary | ICD-10-CM

## 2022-01-25 ENCOUNTER — Telehealth: Payer: Self-pay

## 2022-01-25 NOTE — Telephone Encounter (Addendum)
Prior Authorization initiated Amphetamine-Dextroamphet ER 30MG  er capsules #60 [employer] ID:   Approval received As long as you remain covered by the [facility] and there are no changes to your plan benefits, this request is approved for the following time period: 01/25/2022 - 01/25/2025

## 2022-03-16 ENCOUNTER — Telehealth: Payer: Self-pay | Admitting: Adult Health

## 2022-03-16 NOTE — Telephone Encounter (Signed)
Patient's spouse lvm requesting a refill on the Adderall.  Next Appointment 9/21  Contact [place] # [phone]

## 2022-03-16 NOTE — Telephone Encounter (Signed)
[facility] [place]. Left husband know.

## 2022-03-30 ENCOUNTER — Telehealth (INDEPENDENT_AMBULATORY_CARE_PROVIDER_SITE_OTHER): Payer: Self-pay | Admitting: Adult Health

## 2022-03-30 DIAGNOSIS — F489 Nonpsychotic mental disorder, unspecified: Secondary | ICD-10-CM

## 2022-03-30 NOTE — Progress Notes (Signed)
Patient no show appointment. Tried calling pt phone - VM not set up.

## 2022-04-22 ENCOUNTER — Other Ambulatory Visit: Payer: Self-pay | Admitting: Adult Health

## 2022-04-22 DIAGNOSIS — F331 Major depressive disorder, recurrent, moderate: Secondary | ICD-10-CM

## 2022-04-22 DIAGNOSIS — F411 Generalized anxiety disorder: Secondary | ICD-10-CM

## 2022-04-25 NOTE — Telephone Encounter (Signed)
Please schedule appt

## 2022-04-26 ENCOUNTER — Other Ambulatory Visit: Payer: Self-pay

## 2022-04-26 ENCOUNTER — Telehealth: Payer: Self-pay | Admitting: Adult Health

## 2022-04-26 DIAGNOSIS — F909 Attention-deficit hyperactivity disorder, unspecified type: Secondary | ICD-10-CM

## 2022-04-26 MED ORDER — AMPHETAMINE-DEXTROAMPHET ER 30 MG PO CP24: 30.0000 mg | ORAL_CAPSULE | Freq: Two times a day (BID) | ORAL | 0 refills | Status: DC

## 2022-04-26 NOTE — Telephone Encounter (Signed)
Pt LVM requesting Rx for generic ad[facility]all  [facility]  Apt 10/26

## 2022-04-26 NOTE — Telephone Encounter (Signed)
Pended.

## 2022-05-04 ENCOUNTER — Telehealth (INDEPENDENT_AMBULATORY_CARE_PROVIDER_SITE_OTHER): Payer: BC Managed Care – PPO | Admitting: Adult Health

## 2022-05-04 ENCOUNTER — Encounter: Payer: Self-pay | Admitting: Adult Health

## 2022-05-04 DIAGNOSIS — F411 Generalized anxiety disorder: Secondary | ICD-10-CM | POA: Diagnosis not present

## 2022-05-04 DIAGNOSIS — G4700 Insomnia, unspecified: Secondary | ICD-10-CM

## 2022-05-04 DIAGNOSIS — F909 Attention-deficit hyperactivity disorder, unspecified type: Secondary | ICD-10-CM | POA: Diagnosis not present

## 2022-05-04 DIAGNOSIS — F331 Major depressive disorder, recurrent, moderate: Secondary | ICD-10-CM

## 2022-05-04 MED ORDER — AMPHETAMINE-DEXTROAMPHET ER 30 MG PO CP24: 30.0000 mg | ORAL_CAPSULE | Freq: Two times a day (BID) | ORAL | 0 refills | Status: DC

## 2022-05-04 MED ORDER — BUPROPION HCL ER (XL) 300 MG PO TB24: ORAL_TABLET | ORAL | 3 refills | Status: DC

## 2022-05-04 MED ORDER — SERTRALINE HCL 100 MG PO TABS: 100.0000 mg | ORAL_TABLET | Freq: Every day | ORAL | 3 refills | Status: DC

## 2022-05-04 NOTE — Progress Notes (Signed)
Meagan Austin [id number] [birth year] 31 y.o.  Virtual Visit via Video Note  I connected with pt @ on 05/04/22 at  8:20 AM EDT by a video enabled telemedicine application and verified that I am speaking with the correct person using two identifiers.   I discussed the limitations of evaluation and management by telemedicine and the availability of in person appointments. The patient expressed understanding and agreed to proceed.  I discussed the assessment and treatment plan with the patient. The patient was provided an opportunity to ask questions and all were answered. The patient agreed with the plan and demonstrated an understanding of the instructions.   The patient was advised to call back or seek an in-person evaluation if the symptoms worsen or if the condition fails to improve as anticipated.  I provided 25 minutes of non-face-to-face time during this encounter.  The patient was located at home.  The provider was located at [place].   [name], NP   Subjective:   Patient ID:  Meagan Austin is a 31 y.o. (DOB 1990/11/28) female.  Chief Complaint: No chief complaint on file.   HPI Meagan Austin presents for follow-up of MDD, GAD, insomnia and ADHD.   Referred by therapist - Margarita Grizzle in [place].  Describes mood today as "lower". Pleasant. Denies tearfulness. Mood symptoms - reports decreased depression and irritability. Denies anxiety. Denies recent panic attacks. Mood is consistent. Stating "I think things are going pretty good". Feels like medications are working well. Improved interest and motivation - "struggles in the mornings". Taking medications as prescribed.  Energy levels lower in the morning - able to push through it during the week because she has to. Active, does not have a regular exercise routine.  Enjoys some usual interests and activities. Married. Lives with husband and 2 children - 10 and 5. Parents local. Spending time with family.   Appetite adequate. Weight gain  Sleeps well most nights. Averages 6 hours. Focus and concentration stable. Completing tasks. Managing aspects of household. 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   Denies SI or HI.  Denies AH or VH.  Review of Systems:  Review of Systems  Musculoskeletal:  Negative for gait problem.  Neurological:  Negative for tremors.  Psychiatric/Behavioral:         Please refer to HPI    Medications: I have reviewed the patient's current medications.  Current Outpatient Medications  Medication Sig Dispense Refill   Alfalfa 500 MG TABS Take 4 tablets (2,000 mg total) by mouth daily. Start with one tablet and increase by one every 1-2 days  0   Amphetamine ER (ADZENYS XR-ODT) 18.8 MG TBED Take 18.8 mg by mouth daily. 30 tablet 0   amphetamine-dextroamphetamine (ADDERALL XR) 30 MG 24 hr capsule Take 1 capsule (30 mg total) by mouth 2 (two) times daily. 60 capsule 0   [START ON 06/01/2022] amphetamine-dextroamphetamine (ADDERALL XR) 30 MG 24 hr capsule Take 1 capsule (30 mg total) by mouth 2 (two) times daily. 60 capsule 0   [START ON 06/29/2022] amphetamine-dextroamphetamine (ADDERALL XR) 30 MG 24 hr capsule Take 1 capsule (30 mg total) by mouth 2 (two) times daily. 60 capsule 0   benzocaine-Menthol (DERMOPLAST) 20-0.5 % AERO Apply 1 application topically as needed for irritation (perineal discomfort).     buPROPion (WELLBUTRIN XL) 300 MG 24 hr tablet TAKE 1 TABLET BY MOUTH EVERY MORNING 90 tablet 3   coconut oil OIL Apply 1 application topically as needed.  0   magnesium oxide (MAG-OX) 400 (241.3 Mg) MG tablet Take 400 mg by mouth daily.     Prenatal MV-Min-Fe Fum-FA-DHA [facility] PRENATAL EARLY SHIELD) 27-0.8 & 200 MG MISC Take 2 tablets by mouth daily.     sertraline (ZOLOFT) 100 MG tablet Take 1 tablet (100 mg total) by mouth daily. 90 tablet 3   No current facility-administered medications for this visit.    Medication Side  Effects: None  Allergies:  Allergies  Allergen Reactions   Food Rash and Other (See Comments)    Allergy:  Pears    Latex Rash    Past Medical History:  Diagnosis Date   Bell's palsy    with first pregnancy   Irritable bowel syndrome (IBS) 06-29-11   Bilat ear infections, finished  Amoxicillin last weekend (06-24-11)   Neuromuscular disorder ([place])    R side Bell's Palsy   No pertinent past medical history    Preterm labor     No family history on file.  Social History   Socioeconomic History   Marital status: Married    Spouse name: Not on file   Number of children: Not on file   Years of education: Not on file   Highest education level: Not on file  Occupational History   Not on file  Tobacco Use   Smoking status: Never   Smokeless tobacco: Never  Substance and Sexual Activity   Alcohol use: No   Drug use: No   Sexual activity: Yes    Birth control/protection: None  Other Topics Concern   Not on file  Social History Narrative   Not on file   Social Determinants of Health   Financial Resource Strain: Not on file  Food Insecurity: Not on file  Transportation Needs: Not on file  Physical Activity: Not on file  Stress: Not on file  Social Connections: Not on file  Intimate Partner Violence: Not on file    Past Medical History, Surgical history, Social history, and Family history were reviewed and updated as appropriate.   Please see review of systems for further details on the patient's review from today.   Objective:   Physical Exam:  There were no vitals taken for this visit.  Physical Exam Constitutional:      General: She is not in acute distress. Musculoskeletal:        General: No deformity.  Neurological:     Mental Status: She is alert and oriented to person, place, and time.     Coordination: Coordination normal.  Psychiatric:        Attention and Perception: Attention and perception normal. She does not perceive auditory or visual  hallucinations.        Mood and Affect: Mood normal. Mood is not anxious or depressed. Affect is not labile, blunt, angry or inappropriate.        Speech: Speech normal.        Behavior: Behavior normal.        Thought Content: Thought content normal. Thought content is not paranoid or delusional. Thought content does not include homicidal or suicidal ideation. Thought content does not include homicidal or suicidal plan.        Cognition and Memory: Cognition and memory normal.        Judgment: Judgment normal.     Comments: Insight intact     Lab Review:     Component Value Date/Time   NA 135 12/06/2010 1222   K 3.9 12/06/2010 1222  CL 99 12/06/2010 1222   CO2 22 12/06/2010 1222   GLUCOSE 80 12/06/2010 1222   BUN 8 12/06/2010 1222   CREATININE 0.55 12/06/2010 1222   CALCIUM 9.7 12/06/2010 1222   PROT 7.2 12/06/2010 1222   ALBUMIN 3.6 12/06/2010 1222   AST 18 12/06/2010 1222   ALT 14 12/06/2010 1222   ALKPHOS 67 12/06/2010 1222   BILITOT 0.3 12/06/2010 1222   GFRNONAA >60 12/06/2010 1222   GFRAA  12/06/2010 1222    >60        The eGFR has been calculated using the MDRD equation. This calculation has not been validated in all clinical situations. eGFR's persistently <60 mL/min signify possible Chronic Kidney Disease.       Component Value Date/Time   WBC 7.5 06/23/2016 0530   RBC 2.95 (L) 06/23/2016 0530   HGB 9.4 (L) 06/23/2016 0530   HCT 27.1 (L) 06/23/2016 0530   PLT 164 06/23/2016 0530   MCV 91.9 06/23/2016 0530   MCH 31.9 06/23/2016 0530   MCHC 34.7 06/23/2016 0530   RDW 15.9 (H) 06/23/2016 0530    No results found for: "POCLITH", "LITHIUM"   No results found for: "PHENYTOIN", "PHENOBARB", "VALPROATE", "CBMZ"   .res Assessment: Plan:    Plan:  1. Zoloft 122m daily 2. Adderall XR 351mevery morning to BID 3. Wellbutrin XL 30045maily - denies seizure history - consider increase.  Plans to have labs drawn and will call with an update.  Continue  therapy  RTC 3 months  Patient advised to contact office with any questions, adverse effects, or acute worsening in signs and symptoms.  Discussed potential benefits, risks, and side effects of stimulants with patient to include increased heart rate, palpitations, insomnia, increased anxiety, increased irritability, or decreased appetite. Instructed patient to contact office if experiencing any significant tolerability issues.  Diagnoses and all orders for this visit:  Major depressive disorder, recurrent episode, moderate (HCC) -     sertraline (ZOLOFT) 100 MG tablet; Take 1 tablet (100 mg total) by mouth daily. -     buPROPion (WELLBUTRIN XL) 300 MG 24 hr tablet; TAKE 1 TABLET BY MOUTH EVERY MORNING  Generalized anxiety disorder -     sertraline (ZOLOFT) 100 MG tablet; Take 1 tablet (100 mg total) by mouth daily. -     buPROPion (WELLBUTRIN XL) 300 MG 24 hr tablet; TAKE 1 TABLET BY MOUTH EVERY MORNING  Insomnia, unspecified type -     sertraline (ZOLOFT) 100 MG tablet; Take 1 tablet (100 mg total) by mouth daily.  Attention deficit hyperactivity disorder (ADHD), unspecified ADHD type -     amphetamine-dextroamphetamine (ADDERALL XR) 30 MG 24 hr capsule; Take 1 capsule (30 mg total) by mouth 2 (two) times daily. -     amphetamine-dextroamphetamine (ADDERALL XR) 30 MG 24 hr capsule; Take 1 capsule (30 mg total) by mouth 2 (two) times daily. -     amphetamine-dextroamphetamine (ADDERALL XR) 30 MG 24 hr capsule; Take 1 capsule (30 mg total) by mouth 2 (two) times daily.     Please see After Visit Summary for patient specific instructions.  No future appointments.  No orders of the defined types were placed in this encounter.     -------------------------------

## 2022-05-10 NOTE — Telephone Encounter (Signed)
Pt had an appointment on 10/26

## 2022-07-11 ENCOUNTER — Other Ambulatory Visit: Payer: Self-pay

## 2022-07-11 ENCOUNTER — Telehealth: Payer: Self-pay | Admitting: Adult Health

## 2022-07-11 NOTE — Telephone Encounter (Signed)
Has a RF available at the requested pharmacy.

## 2022-07-11 NOTE — Telephone Encounter (Addendum)
Pt requesting Rx for generic adderall 30 mg XR @ [facility] [place]. Apt 1/26

## 2022-08-04 ENCOUNTER — Ambulatory Visit (INDEPENDENT_AMBULATORY_CARE_PROVIDER_SITE_OTHER): Payer: Self-pay | Admitting: Adult Health

## 2022-08-04 DIAGNOSIS — F489 Nonpsychotic mental disorder, unspecified: Secondary | ICD-10-CM

## 2022-08-04 NOTE — Progress Notes (Signed)
Patient no show appointment. ? ?

## 2022-08-25 ENCOUNTER — Telehealth: Payer: Self-pay | Admitting: Adult Health

## 2022-08-25 ENCOUNTER — Other Ambulatory Visit: Payer: Self-pay

## 2022-08-25 DIAGNOSIS — F909 Attention-deficit hyperactivity disorder, unspecified type: Secondary | ICD-10-CM

## 2022-08-25 MED ORDER — AMPHETAMINE-DEXTROAMPHET ER 30 MG PO CP24: 30.0000 mg | ORAL_CAPSULE | Freq: Two times a day (BID) | ORAL | 0 refills | Status: DC

## 2022-08-25 NOTE — Telephone Encounter (Signed)
Pended.

## 2022-08-25 NOTE — Telephone Encounter (Signed)
Pt called at 2:38p.  She would like refill of Ad[facility]   [id number] [place], [place] - [id number]: [phone] Fax: [phone]  Next appt 2/29

## 2022-09-07 ENCOUNTER — Ambulatory Visit (INDEPENDENT_AMBULATORY_CARE_PROVIDER_SITE_OTHER): Payer: Self-pay | Admitting: Adult Health

## 2022-09-07 DIAGNOSIS — F489 Nonpsychotic mental disorder, unspecified: Secondary | ICD-10-CM

## 2022-09-07 NOTE — Progress Notes (Signed)
Patient no show appointment. ? ?

## 2022-10-04 ENCOUNTER — Telehealth: Payer: Self-pay | Admitting: Adult Health

## 2022-10-17 ENCOUNTER — Other Ambulatory Visit: Payer: Self-pay

## 2022-10-17 ENCOUNTER — Ambulatory Visit (INDEPENDENT_AMBULATORY_CARE_PROVIDER_SITE_OTHER): Payer: Self-pay | Admitting: Adult Health

## 2022-10-17 DIAGNOSIS — F909 Attention-deficit hyperactivity disorder, unspecified type: Secondary | ICD-10-CM

## 2022-10-17 DIAGNOSIS — Z0389 Encounter for observation for other suspected diseases and conditions ruled out: Secondary | ICD-10-CM

## 2022-10-17 MED ORDER — AMPHETAMINE-DEXTROAMPHET ER 30 MG PO CP24: 30.0000 mg | ORAL_CAPSULE | Freq: Two times a day (BID) | ORAL | 0 refills | Status: DC

## 2022-10-17 NOTE — Progress Notes (Signed)
Patient no show appointment - 16 mins late.

## 2022-11-16 ENCOUNTER — Encounter: Payer: Self-pay | Admitting: Adult Health

## 2022-11-16 ENCOUNTER — Ambulatory Visit (INDEPENDENT_AMBULATORY_CARE_PROVIDER_SITE_OTHER): Payer: BC Managed Care – PPO | Admitting: Adult Health

## 2022-11-16 DIAGNOSIS — F331 Major depressive disorder, recurrent, moderate: Secondary | ICD-10-CM

## 2022-11-16 DIAGNOSIS — F909 Attention-deficit hyperactivity disorder, unspecified type: Secondary | ICD-10-CM

## 2022-11-16 DIAGNOSIS — G4700 Insomnia, unspecified: Secondary | ICD-10-CM | POA: Diagnosis not present

## 2022-11-16 DIAGNOSIS — F411 Generalized anxiety disorder: Secondary | ICD-10-CM | POA: Diagnosis not present

## 2022-11-16 MED ORDER — AMPHETAMINE-DEXTROAMPHET ER 30 MG PO CP24: 30.0000 mg | ORAL_CAPSULE | Freq: Two times a day (BID) | ORAL | 0 refills | Status: DC

## 2022-11-16 NOTE — Progress Notes (Signed)
Meagan Austin [id number] 10-18-1990 32 y.o.  Subjective:   Patient ID:  Meagan Austin is a 32 y.o. (DOB 05/19/1991) female.  Chief Complaint: No chief complaint on file.   HPI Meagan Austin presents to the office today for follow-up of MDD, GAD, insomnia and ADHD.   Referred by therapist - Jerelyn Charles in [place].  Describes mood today as "lower". Pleasant. Denies tearfulness. Mood symptoms - reports decreased depression and irritability. Denies anxiety. Denies recent panic attacks. Denies Mood is consistent. Stating "I'm doing pretty good". Feels like medications work well. Stable interest and motivation. Taking medications as prescribed.  Energy levels improved. Active, does not have a regular exercise routine.  Enjoys some usual interests and activities. Married. Lives with husband and 2 children - 11 and 6. Parents local. Spending time with family.  Appetite adequate. Weight gain  Sleeps well most nights. Averages 6 hours. Focus and concentration stable. Completing tasks. Managing aspects of household. 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   Denies SI or HI.  Denies AH or VH. Denies self harm. Denies substance use.  Review of Systems:  Review of Systems  Musculoskeletal:  Negative for gait problem.  Neurological:  Negative for tremors.  Psychiatric/Behavioral:         Please refer to HPI    Medications: I have reviewed the patient's current medications.  Current Outpatient Medications  Medication Sig Dispense Refill   Alfalfa 500 MG TABS Take 4 tablets (2,000 mg total) by mouth daily. Start with one tablet and increase by one every 1-2 days  0   amphetamine-dextroamphetamine (ADDERALL XR) 30 MG 24 hr capsule Take 1 capsule (30 mg total) by mouth 2 (two) times daily. 60 capsule 0   [START ON 12/14/2022] amphetamine-dextroamphetamine (ADDERALL XR) 30 MG 24 hr capsule Take 1 capsule (30 mg total) by mouth 2 (two) times daily. 60 capsule 0   [START ON  01/11/2023] amphetamine-dextroamphetamine (ADDERALL XR) 30 MG 24 hr capsule Take 1 capsule (30 mg total) by mouth 2 (two) times daily. 60 capsule 0   benzocaine-Menthol (DERMOPLAST) 20-0.5 % AERO Apply 1 application topically as needed for irritation (perineal discomfort).     buPROPion (WELLBUTRIN XL) 300 MG 24 hr tablet TAKE 1 TABLET BY MOUTH EVERY MORNING 90 tablet 3   coconut oil OIL Apply 1 application topically as needed.  0   magnesium oxide (MAG-OX) 400 (241.3 Mg) MG tablet Take 400 mg by mouth daily.     Prenatal MV-Min-Fe Fum-FA-DHA [facility] PRENATAL EARLY SHIELD) 27-0.8 & 200 MG MISC Take 2 tablets by mouth daily.     sertraline (ZOLOFT) 100 MG tablet Take 1 tablet (100 mg total) by mouth daily. 90 tablet 3   No current facility-administered medications for this visit.    Medication Side Effects: None  Allergies:  Allergies  Allergen Reactions   Food Rash and Other (See Comments)    Allergy:  Pears    Latex Rash    Past Medical History:  Diagnosis Date   Bell's palsy    with first pregnancy   Irritable bowel syndrome (IBS) 06-29-11   Bilat ear infections, finished  Amoxicillin last weekend (06-24-11)   Neuromuscular disorder (HCC)    R side Bell's Palsy   No pertinent past medical history    Preterm labor     Past Medical History, Surgical history, Social history, and Family history were reviewed and updated as appropriate.   Please see review of systems for further details on the  patient's review from today.   Objective:   Physical Exam:  There were no vitals taken for this visit.  Physical Exam Constitutional:      General: She is not in acute distress. Musculoskeletal:        General: No deformity.  Neurological:     Mental Status: She is alert and oriented to person, place, and time.     Coordination: Coordination normal.  Psychiatric:        Attention and Perception: Attention and perception normal. She does not perceive auditory or visual  hallucinations.        Mood and Affect: Mood normal. Mood is not anxious or depressed. Affect is not labile, blunt, angry or inappropriate.        Speech: Speech normal.        Behavior: Behavior normal.        Thought Content: Thought content normal. Thought content is not paranoid or delusional. Thought content does not include homicidal or suicidal ideation. Thought content does not include homicidal or suicidal plan.        Cognition and Memory: Cognition and memory normal.        Judgment: Judgment normal.     Comments: Insight intact     Lab Review:     Component Value Date/Time   NA 135 12/06/2010 1222   K 3.9 12/06/2010 1222   CL 99 12/06/2010 1222   CO2 22 12/06/2010 1222   GLUCOSE 80 12/06/2010 1222   BUN 8 12/06/2010 1222   CREATININE 0.55 12/06/2010 1222   CALCIUM 9.7 12/06/2010 1222   PROT 7.2 12/06/2010 1222   ALBUMIN 3.6 12/06/2010 1222   AST 18 12/06/2010 1222   ALT 14 12/06/2010 1222   ALKPHOS 67 12/06/2010 1222   BILITOT 0.3 12/06/2010 1222   GFRNONAA >60 12/06/2010 1222   GFRAA  12/06/2010 1222    >60        The eGFR has been calculated using the MDRD equation. This calculation has not been validated in all clinical situations. eGFR's persistently <60 mL/min signify possible Chronic Kidney Disease.       Component Value Date/Time   WBC 7.5 06/23/2016 0530   RBC 2.95 (L) 06/23/2016 0530   HGB 9.4 (L) 06/23/2016 0530   HCT 27.1 (L) 06/23/2016 0530   PLT 164 06/23/2016 0530   MCV 91.9 06/23/2016 0530   MCH 31.9 06/23/2016 0530   MCHC 34.7 06/23/2016 0530   RDW 15.9 (H) 06/23/2016 0530    No results found for: "POCLITH", "LITHIUM"   No results found for: "PHENYTOIN", "PHENOBARB", "VALPROATE", "CBMZ"   .res Assessment: Plan:    Plan:  1. Zoloft 100mg  daily 2. Adderall XR 30mg  every morning to BID 3. Wellbutrin XL 300mg  daily - denies seizure history - consider increase.  Continue therapy  RTC 3 months  Patient advised to contact  office with any questions, adverse effects, or acute worsening in signs and symptoms.  Discussed potential benefits, risks, and side effects of stimulants with patient to include increased heart rate, palpitations, insomnia, increased anxiety, increased irritability, or decreased appetite. Instructed patient to contact office if experiencing any significant tolerability issues.  Diagnoses and all orders for this visit:  Attention deficit hyperactivity disorder (ADHD), unspecified ADHD type -     amphetamine-dextroamphetamine (ADDERALL XR) 30 MG 24 hr capsule; Take 1 capsule (30 mg total) by mouth 2 (two) times daily. -     amphetamine-dextroamphetamine (ADDERALL XR) 30 MG 24 hr capsule; Take 1 capsule (  30 mg total) by mouth 2 (two) times daily. -     amphetamine-dextroamphetamine (ADDERALL XR) 30 MG 24 hr capsule; Take 1 capsule (30 mg total) by mouth 2 (two) times daily.     Please see After Visit Summary for patient specific instructions.  Future Appointments  Date Time Provider Department Center  05/21/2023 11:20 AM Nayzeth Altman, Thereasa Solo, NP [facility] None    No orders of the defined types were placed in this encounter.   -------------------------------

## 2023-01-25 NOTE — Telephone Encounter (Signed)
Error

## 2023-03-29 ENCOUNTER — Other Ambulatory Visit: Payer: Self-pay

## 2023-03-29 ENCOUNTER — Telehealth: Payer: Self-pay | Admitting: Adult Health

## 2023-03-29 DIAGNOSIS — F909 Attention-deficit hyperactivity disorder, unspecified type: Secondary | ICD-10-CM

## 2023-03-29 MED ORDER — AMPHETAMINE-DEXTROAMPHET ER 30 MG PO CP24
30.0000 mg | ORAL_CAPSULE | Freq: Two times a day (BID) | ORAL | 0 refills | Status: DC
Start: 2023-03-29 — End: 2023-08-03

## 2023-03-29 NOTE — Telephone Encounter (Signed)
Pended.

## 2023-03-29 NOTE — Telephone Encounter (Signed)
Spouse, Apolinar Junes,, called at 1:09 to request refill of Tabia's  Adderall Xr 30mg .  Appt 11/11.  Send to [facility] [phone] - [place], [place] - [address]

## 2023-05-02 ENCOUNTER — Other Ambulatory Visit: Payer: Self-pay | Admitting: Adult Health

## 2023-05-02 DIAGNOSIS — G4700 Insomnia, unspecified: Secondary | ICD-10-CM

## 2023-05-02 DIAGNOSIS — F411 Generalized anxiety disorder: Secondary | ICD-10-CM

## 2023-05-02 DIAGNOSIS — F331 Major depressive disorder, recurrent, moderate: Secondary | ICD-10-CM

## 2023-05-07 ENCOUNTER — Other Ambulatory Visit: Payer: Self-pay

## 2023-05-07 ENCOUNTER — Telehealth: Payer: Self-pay | Admitting: Adult Health

## 2023-05-07 DIAGNOSIS — F909 Attention-deficit hyperactivity disorder, unspecified type: Secondary | ICD-10-CM

## 2023-05-07 MED ORDER — AMPHETAMINE-DEXTROAMPHET ER 30 MG PO CP24: 30.0000 mg | ORAL_CAPSULE | Freq: Two times a day (BID) | ORAL | 0 refills | Status: AC

## 2023-05-07 NOTE — Telephone Encounter (Signed)
Raquelin called at 4:50 to request refill of her Adderall 30mg  2/day.  [id number] [place] [place] - [address] to

## 2023-05-07 NOTE — Telephone Encounter (Signed)
PENDED

## 2023-05-21 ENCOUNTER — Ambulatory Visit (INDEPENDENT_AMBULATORY_CARE_PROVIDER_SITE_OTHER): Payer: Self-pay | Admitting: Adult Health

## 2023-05-21 DIAGNOSIS — Z0389 Encounter for observation for other suspected diseases and conditions ruled out: Secondary | ICD-10-CM

## 2023-05-21 NOTE — Progress Notes (Signed)
Patient no show appointment. ? ?

## 2023-06-15 ENCOUNTER — Telehealth: Payer: Self-pay | Admitting: Adult Health

## 2023-06-15 NOTE — Telephone Encounter (Signed)
Husband called and made an appt for his wife for tues 12/10. She needs a refill on her adderall xr 30 mg. Pharmacy is [facility] on [address] in [place]

## 2023-06-17 ENCOUNTER — Other Ambulatory Visit: Payer: Self-pay

## 2023-06-17 DIAGNOSIS — F909 Attention-deficit hyperactivity disorder, unspecified type: Secondary | ICD-10-CM

## 2023-06-17 NOTE — Telephone Encounter (Signed)
Pended 30 XR Adderall to [facility].

## 2023-06-18 MED ORDER — AMPHETAMINE-DEXTROAMPHET ER 30 MG PO CP24: 30.0000 mg | ORAL_CAPSULE | Freq: Two times a day (BID) | ORAL | 0 refills | Status: AC

## 2023-06-19 ENCOUNTER — Ambulatory Visit (INDEPENDENT_AMBULATORY_CARE_PROVIDER_SITE_OTHER): Payer: BC Managed Care – PPO | Admitting: Adult Health

## 2023-06-19 DIAGNOSIS — Z0389 Encounter for observation for other suspected diseases and conditions ruled out: Secondary | ICD-10-CM

## 2023-06-19 NOTE — Progress Notes (Signed)
Patient no show appointment. ? ?

## 2023-07-16 ENCOUNTER — Other Ambulatory Visit: Payer: Self-pay | Admitting: Adult Health

## 2023-07-16 DIAGNOSIS — F331 Major depressive disorder, recurrent, moderate: Secondary | ICD-10-CM

## 2023-07-16 DIAGNOSIS — F411 Generalized anxiety disorder: Secondary | ICD-10-CM

## 2023-07-16 DIAGNOSIS — G4700 Insomnia, unspecified: Secondary | ICD-10-CM

## 2023-07-16 NOTE — Telephone Encounter (Signed)
 Please schedule pt an appt. LV 05/9 NS 11/11 AND 12/10

## 2023-07-24 ENCOUNTER — Telehealth: Payer: Self-pay | Admitting: Adult Health

## 2023-07-24 NOTE — Telephone Encounter (Signed)
 Patient called wanting to make an appt. Warning letter for NoShows was send 11/20/2022. Has had two appts scheduled since then and has no showed. NO additional appts should be given and Term letter should be sent.

## 2023-07-25 NOTE — Telephone Encounter (Signed)
 Pt is aware of the term letter. She needs refills on her zoloft , wellbutrin  xl and her adderall xr 30 mg. She was told we would give her one month of meds. [facility] [address] in [place]

## 2023-08-03 ENCOUNTER — Other Ambulatory Visit: Payer: Self-pay

## 2023-08-03 DIAGNOSIS — F411 Generalized anxiety disorder: Secondary | ICD-10-CM

## 2023-08-03 DIAGNOSIS — F331 Major depressive disorder, recurrent, moderate: Secondary | ICD-10-CM

## 2023-08-03 DIAGNOSIS — F909 Attention-deficit hyperactivity disorder, unspecified type: Secondary | ICD-10-CM

## 2023-08-03 DIAGNOSIS — G4700 Insomnia, unspecified: Secondary | ICD-10-CM

## 2023-08-03 MED ORDER — SERTRALINE HCL 100 MG PO TABS: 100.0000 mg | ORAL_TABLET | Freq: Every day | ORAL | 0 refills | Status: AC

## 2023-08-03 MED ORDER — BUPROPION HCL ER (XL) 300 MG PO TB24: ORAL_TABLET | ORAL | 0 refills | Status: AC

## 2023-08-03 MED ORDER — AMPHETAMINE-DEXTROAMPHET ER 30 MG PO CP24: 30.0000 mg | ORAL_CAPSULE | Freq: Two times a day (BID) | ORAL | 0 refills | Status: AC

## 2023-08-17 NOTE — Telephone Encounter (Signed)
 The dismissal letter says meds will be provided for one month. We already sent meds.

## 2023-08-17 NOTE — Telephone Encounter (Signed)
 RF sent for all meds 08/03/23.

## 2023-08-17 NOTE — Telephone Encounter (Signed)
 Pt was sent letter of dismissal due to NS. No R/S here but we do have obligation to fill RX for length of time stated on letter.

## 2023-08-26 ENCOUNTER — Other Ambulatory Visit: Payer: Self-pay | Admitting: Adult Health

## 2023-08-26 DIAGNOSIS — F411 Generalized anxiety disorder: Secondary | ICD-10-CM

## 2023-08-26 DIAGNOSIS — F331 Major depressive disorder, recurrent, moderate: Secondary | ICD-10-CM

## 2023-08-29 NOTE — Telephone Encounter (Signed)
 Meagan Austin said she would give another month RF if patient did not have a new provider. LVM to [facility] to ask about having a new provider.
